# Patient Record
Sex: Female | Born: 1940 | Race: White | Hispanic: No | Smoking: Never smoker
Health system: Southern US, Community
[De-identification: ages and names within clinical notes are randomized; demographics above are authoritative.]

## PROBLEM LIST (undated history)

## (undated) DIAGNOSIS — J45909 Unspecified asthma, uncomplicated: Secondary | ICD-10-CM

## (undated) DIAGNOSIS — J069 Acute upper respiratory infection, unspecified: Secondary | ICD-10-CM

## (undated) DIAGNOSIS — E785 Hyperlipidemia, unspecified: Secondary | ICD-10-CM

## (undated) DIAGNOSIS — K219 Gastro-esophageal reflux disease without esophagitis: Secondary | ICD-10-CM

## (undated) DIAGNOSIS — I10 Essential (primary) hypertension: Secondary | ICD-10-CM

## (undated) DIAGNOSIS — L509 Urticaria, unspecified: Secondary | ICD-10-CM

## (undated) DIAGNOSIS — E079 Disorder of thyroid, unspecified: Secondary | ICD-10-CM

## (undated) HISTORY — PX: ADENOIDECTOMY: SUR15

## (undated) HISTORY — PX: APPENDECTOMY: SHX54

## (undated) HISTORY — PX: TONSILLECTOMY: SUR1361

## (undated) HISTORY — PX: CHOLECYSTECTOMY: SHX55

## (undated) HISTORY — DX: Urticaria, unspecified: L50.9

## (undated) HISTORY — PX: FRACTURE SURGERY: SHX138

## (undated) HISTORY — DX: Acute upper respiratory infection, unspecified: J06.9

## (undated) HISTORY — PX: ABDOMINAL HYSTERECTOMY: SHX81

---

## 2006-04-10 ENCOUNTER — Encounter: Admission: RE | Admit: 2006-04-10 | Discharge: 2006-04-10 | Payer: Self-pay | Admitting: Family Medicine

## 2007-05-20 ENCOUNTER — Encounter: Admission: RE | Admit: 2007-05-20 | Discharge: 2007-05-20 | Payer: Self-pay | Admitting: Family Medicine

## 2008-07-17 ENCOUNTER — Encounter: Admission: RE | Admit: 2008-07-17 | Discharge: 2008-07-17 | Payer: Self-pay

## 2011-05-24 ENCOUNTER — Emergency Department (INDEPENDENT_AMBULATORY_CARE_PROVIDER_SITE_OTHER): Payer: Medicare Other

## 2011-05-24 ENCOUNTER — Encounter (HOSPITAL_BASED_OUTPATIENT_CLINIC_OR_DEPARTMENT_OTHER): Payer: Self-pay | Admitting: *Deleted

## 2011-05-24 ENCOUNTER — Emergency Department (HOSPITAL_BASED_OUTPATIENT_CLINIC_OR_DEPARTMENT_OTHER)
Admission: EM | Admit: 2011-05-24 | Discharge: 2011-05-24 | Disposition: A | Discharge status: Home / Self Care | Payer: Medicare Other | Attending: Emergency Medicine | Admitting: Emergency Medicine

## 2011-05-24 DIAGNOSIS — R079 Chest pain, unspecified: Secondary | ICD-10-CM

## 2011-05-24 DIAGNOSIS — W19XXXA Unspecified fall, initial encounter: Secondary | ICD-10-CM

## 2011-05-24 DIAGNOSIS — E785 Hyperlipidemia, unspecified: Secondary | ICD-10-CM | POA: Insufficient documentation

## 2011-05-24 DIAGNOSIS — S42202A Unspecified fracture of upper end of left humerus, initial encounter for closed fracture: Secondary | ICD-10-CM

## 2011-05-24 DIAGNOSIS — S2239XA Fracture of one rib, unspecified side, initial encounter for closed fracture: Secondary | ICD-10-CM | POA: Insufficient documentation

## 2011-05-24 DIAGNOSIS — K219 Gastro-esophageal reflux disease without esophagitis: Secondary | ICD-10-CM | POA: Insufficient documentation

## 2011-05-24 DIAGNOSIS — R918 Other nonspecific abnormal finding of lung field: Secondary | ICD-10-CM

## 2011-05-24 DIAGNOSIS — Y92009 Unspecified place in unspecified non-institutional (private) residence as the place of occurrence of the external cause: Secondary | ICD-10-CM | POA: Insufficient documentation

## 2011-05-24 DIAGNOSIS — S42213A Unspecified displaced fracture of surgical neck of unspecified humerus, initial encounter for closed fracture: Secondary | ICD-10-CM | POA: Insufficient documentation

## 2011-05-24 DIAGNOSIS — I10 Essential (primary) hypertension: Secondary | ICD-10-CM | POA: Insufficient documentation

## 2011-05-24 DIAGNOSIS — E079 Disorder of thyroid, unspecified: Secondary | ICD-10-CM | POA: Insufficient documentation

## 2011-05-24 DIAGNOSIS — J9819 Other pulmonary collapse: Secondary | ICD-10-CM

## 2011-05-24 DIAGNOSIS — W1789XA Other fall from one level to another, initial encounter: Secondary | ICD-10-CM | POA: Insufficient documentation

## 2011-05-24 DIAGNOSIS — Y998 Other external cause status: Secondary | ICD-10-CM | POA: Insufficient documentation

## 2011-05-24 HISTORY — DX: Essential (primary) hypertension: I10

## 2011-05-24 HISTORY — DX: Disorder of thyroid, unspecified: E07.9

## 2011-05-24 HISTORY — DX: Gastro-esophageal reflux disease without esophagitis: K21.9

## 2011-05-24 HISTORY — DX: Hyperlipidemia, unspecified: E78.5

## 2011-05-24 LAB — CBC
MCHC: 34.7 g/dL (ref 30.0–36.0)
WBC: 12 10*3/uL — ABNORMAL HIGH (ref 4.0–10.5)

## 2011-05-24 LAB — URINALYSIS, ROUTINE W REFLEX MICROSCOPIC
Bilirubin Urine: NEGATIVE
Glucose, UA: NEGATIVE mg/dL
Ketones, ur: NEGATIVE mg/dL
Nitrite: NEGATIVE
Protein, ur: NEGATIVE mg/dL
pH: 6 (ref 5.0–8.0)

## 2011-05-24 LAB — BASIC METABOLIC PANEL
CO2: 28 mEq/L (ref 19–32)
Chloride: 105 mEq/L (ref 96–112)
Sodium: 140 mEq/L (ref 135–145)

## 2011-05-24 LAB — URINE MICROSCOPIC-ADD ON

## 2011-05-24 LAB — DIFFERENTIAL
Basophils Absolute: 0 10*3/uL (ref 0.0–0.1)
Basophils Relative: 0 % (ref 0–1)
Lymphocytes Relative: 10 % — ABNORMAL LOW (ref 12–46)
Monocytes Absolute: 0.8 10*3/uL (ref 0.1–1.0)
Neutro Abs: 9.9 10*3/uL — ABNORMAL HIGH (ref 1.7–7.7)
Neutrophils Relative %: 83 % — ABNORMAL HIGH (ref 43–77)

## 2011-05-24 MED ORDER — HYDROMORPHONE HCL PF 1 MG/ML IJ SOLN
1.0000 mg | Freq: Once | INTRAMUSCULAR | Status: AC
  Administered 2011-05-24: 1 mg via INTRAVENOUS

## 2011-05-24 MED ORDER — ONDANSETRON HCL 4 MG/2ML IJ SOLN
4.0000 mg | Freq: Once | INTRAMUSCULAR | Status: AC
  Administered 2011-05-24: 4 mg via INTRAMUSCULAR

## 2011-05-24 MED ORDER — IOHEXOL 300 MG/ML  SOLN
80.0000 mL | Freq: Once | INTRAMUSCULAR | Status: AC | PRN
  Administered 2011-05-24: 80 mL via INTRAVENOUS

## 2011-05-24 MED ORDER — SODIUM CHLORIDE 0.9 % IV SOLN
INTRAVENOUS | Status: DC
  Administered 2011-05-24: 19:00:00 via INTRAVENOUS

## 2011-05-24 MED ORDER — HYDROMORPHONE HCL PF 1 MG/ML IJ SOLN
INTRAMUSCULAR | Status: AC
  Filled 2011-05-24: qty 1

## 2011-05-24 MED ORDER — HYDROMORPHONE HCL PF 1 MG/ML IJ SOLN
INTRAMUSCULAR | Status: AC
  Administered 2011-05-24: 1 mg via INTRAVENOUS
  Filled 2011-05-24: qty 1

## 2011-05-24 MED ORDER — ONDANSETRON HCL 4 MG/2ML IJ SOLN
INTRAMUSCULAR | Status: AC
  Administered 2011-05-24: 4 mg via INTRAVENOUS
  Filled 2011-05-24: qty 2

## 2011-05-24 MED ORDER — ONDANSETRON HCL 4 MG/2ML IJ SOLN
INTRAMUSCULAR | Status: AC
  Filled 2011-05-24: qty 2

## 2011-05-24 MED ORDER — ONDANSETRON HCL 4 MG/2ML IJ SOLN
4.0000 mg | Freq: Once | INTRAMUSCULAR | Status: AC
  Administered 2011-05-24: 4 mg via INTRAVENOUS

## 2011-05-24 MED ORDER — HYDROMORPHONE HCL PF 1 MG/ML IJ SOLN
1.0000 mg | Freq: Once | INTRAMUSCULAR | Status: AC
  Administered 2011-05-24: 1 mg via INTRAMUSCULAR

## 2011-05-24 MED ORDER — ONDANSETRON HCL 4 MG PO TABS: 4.0000 mg | ORAL_TABLET | Freq: Four times a day (QID) | ORAL | Status: AC

## 2011-05-24 MED ORDER — OXYCODONE-ACETAMINOPHEN 5-325 MG PO TABS
1.0000 | ORAL_TABLET | Freq: Once | ORAL | Status: AC
  Administered 2011-05-24: 1 via ORAL
  Filled 2011-05-24: qty 1

## 2011-05-24 MED ORDER — OXYCODONE-ACETAMINOPHEN 5-325 MG PO TABS: 1.0000 | ORAL_TABLET | ORAL | Status: AC | PRN

## 2011-05-24 NOTE — ED Notes (Signed)
Pt slipped off of stool landing on left side injuring her left shoulder, left elbow and left ribs. Pt denies loc.

## 2011-05-24 NOTE — ED Notes (Signed)
Pushed approx 1/3 of the dilaudid IV administration, and pt c/o severe nausea. Requesting no more dilaudid IV. Pt ate crackers and drank coke prior to dilaudid, but states she still feels nauseous. Will notify MD.

## 2011-05-24 NOTE — ED Notes (Signed)
Dr. Ignacia Palma informed of pt status.

## 2011-05-24 NOTE — ED Provider Notes (Signed)
History     CSN: [id number]  Arrival date & time 05/24/11  1507   First MD Initiated Contact with Patient 05/24/11 1526      Chief Complaint  Patient presents with  . Fall    (Consider location/radiation/quality/duration/timing/severity/associated sxs/prior treatment) HPI Comments: Patient is a 71 year old woman who was up on a ladder in her kitchen and fell. She hit her left shoulder on the microwave, and also hit her over the chest. She was rendered unconscious. There was no bleeding. The injury happened about 2:30 PM. She took no medications for pain. She was brought to [facility] ED for evaluation.  Patient is a 71 y.o. female presenting with fall. The history is provided by the patient. No language interpreter was used.  Fall The accident occurred 1 to 2 hours ago. The fall occurred from a ladder. She fell from a height of 3 to 5 ft. Impact surface: She hit her shoulder on a microwave oven. There was no blood loss. The point of impact was the left shoulder. The pain is present in the left shoulder. The pain is at a severity of 9/10. The pain is severe. She was ambulatory at the scene. There was no entrapment after the fall. There was no drug use involved in the accident. There was no alcohol use involved in the accident. Exacerbated by: Movement of the left shoulder, palpation of the left shoulder. She has tried immobilization (She was transported to [facility] [place] ED by EMS.) for the symptoms. The treatment provided no relief.    Past Medical History  Diagnosis Date  . Hyperlipidemia   . Hypertension   . Thyroid disease   . GERD (gastroesophageal reflux disease)     Past Surgical History  Procedure Date  . Fracture surgery   . Abdominal hysterectomy   . Appendectomy   . Tonsillectomy     No family history on file.  History  Substance Use Topics  . Smoking status: Never Smoker   . Smokeless tobacco: Not on file  . Alcohol Use: No    OB History    Grav Para Term Preterm Abortions TAB SAB Ect Mult Living                  Review of Systems  All other systems reviewed and are negative.    Allergies  Review of patient's allergies indicates no known allergies.  Home Medications  No current outpatient prescriptions on file.  BP 142/77  Pulse 70  Temp(Src) 98 F (36.7 C) (Oral)  Resp 20  SpO2 97%  Physical Exam  Nursing note and vitals reviewed. Constitutional: She is oriented to person, place, and time. She appears well-developed and well-nourished.       In acute distress with pain in the left shoulder.  HENT:  Head: Normocephalic and atraumatic.  Right Ear: External ear normal.  Left Ear: External ear normal.  Mouth/Throat: Oropharynx is clear and moist.        No bony deformity of the skull. No lacerations.  Eyes: Conjunctivae and EOM are normal. Pupils are equal, round, and reactive to light.  Neck: Normal range of motion. Neck supple.       No bony deformity of the neck.  Cardiovascular: Normal rate and regular rhythm.   Pulmonary/Chest: Effort normal and breath sounds normal.       No bony deformity of the chest or tenderness over the left ribs. There is no crepitance or subcutaneous emphysema.  Abdominal: Soft. Bowel sounds are normal.  Musculoskeletal:       She has tenderness and swelling over the left shoulder, overlying the proximal right humeral head. Skin is intact. She has intact sensation and tendon function in her left arm.  Neurological: She is alert and oriented to person, place, and time.       Sensory or motor deficit.  Skin: Skin is warm and dry.  Psychiatric: She has a normal mood and affect. Her behavior is normal.    ED Course  Procedures (including critical care time)  3:32 PM Patient was seen and had physical examination. IM Dilaudid and Zofran were ordered. X-ray of the left shoulder and chest were ordered.  4:19 PM X-ray of the left shoulder shows an impacted fracture of the surgical  neck of the left humerus. I advised the patient of this. Shoulder immobilizer ordered. Chest x-ray is pending.  4:34 PM Radiology called to inform me that the patient appears to have a fracture of her left sixth rib, and possible hemothorax. He also notes some calcifications in her lungs. CT of the chest with IV contrast was ordered.  7:18 PM CT of the chest showed a nondisplaced fracture of the left sixth rib. There is no pneumothorax or effusion. There is also no mass in the right chest. Patient was advised to rest in a recliner propped up on several pillows. She can take Percocet every 4 hours as needed for pain. She should wear her shoulder immobilizer 24 hours per day. She will need to contact the orthopedist on call, for followup next week.    1. Closed fracture of left proximal humerus            Carleene Cooper III, MD 05/24/11 1541  Carleene Cooper III, MD 05/24/11 Ernestina [place]

## 2011-05-24 NOTE — ED Notes (Signed)
New orders received for CT chest. Pt and family informed.

## 2011-05-24 NOTE — ED Notes (Signed)
Pt and family informed of plan of care.

## 2011-05-24 NOTE — ED Notes (Signed)
Dr Ignacia Palma at bedside to discuss test results. Pt requesting coke and crackers

## 2011-05-24 NOTE — ED Notes (Signed)
Pt pale and diaphoretic. Fluids increased and oxygen applied for sats of 91%. Increased to 97 on oxygen. Cool wash cloth applied to head. Pt stated "yeh im a light weight. i dont do well with strong meds"

## 2011-05-24 NOTE — ED Notes (Signed)
Nausea subsided. Pt assisted into shoulder immobilizer sling and instructed on usage at home. Pt's pain more tolerable at this time. Assisted into wheelchair, and wheeled out to husbands car. Pt assisted into vehicle without difficulties.

## 2011-05-24 NOTE — ED Notes (Signed)
Pt transported to ED room 10.  Family at bedside.

## 2011-07-05 ENCOUNTER — Ambulatory Visit: Payer: Medicare Other | Attending: Orthopedic Surgery | Admitting: Physical Therapy

## 2011-07-05 DIAGNOSIS — IMO0001 Reserved for inherently not codable concepts without codable children: Secondary | ICD-10-CM | POA: Insufficient documentation

## 2011-07-05 DIAGNOSIS — M25519 Pain in unspecified shoulder: Secondary | ICD-10-CM | POA: Insufficient documentation

## 2011-07-05 DIAGNOSIS — M25619 Stiffness of unspecified shoulder, not elsewhere classified: Secondary | ICD-10-CM | POA: Insufficient documentation

## 2011-07-12 ENCOUNTER — Ambulatory Visit: Payer: Medicare Other | Attending: Orthopedic Surgery

## 2011-07-12 DIAGNOSIS — M25619 Stiffness of unspecified shoulder, not elsewhere classified: Secondary | ICD-10-CM | POA: Insufficient documentation

## 2011-07-12 DIAGNOSIS — M25519 Pain in unspecified shoulder: Secondary | ICD-10-CM | POA: Insufficient documentation

## 2011-07-12 DIAGNOSIS — IMO0001 Reserved for inherently not codable concepts without codable children: Secondary | ICD-10-CM | POA: Insufficient documentation

## 2011-07-20 ENCOUNTER — Ambulatory Visit: Payer: Medicare Other | Admitting: Physical Therapy

## 2011-07-26 ENCOUNTER — Encounter: Payer: Medicare Other | Admitting: Physical Therapy

## 2011-08-01 ENCOUNTER — Ambulatory Visit: Payer: Medicare Other | Admitting: Physical Therapy

## 2011-08-03 ENCOUNTER — Ambulatory Visit: Payer: Medicare Other | Admitting: Physical Therapy

## 2011-08-08 ENCOUNTER — Ambulatory Visit: Payer: Medicare Other | Attending: Orthopedic Surgery | Admitting: Physical Therapy

## 2011-08-08 DIAGNOSIS — M25519 Pain in unspecified shoulder: Secondary | ICD-10-CM | POA: Insufficient documentation

## 2011-08-08 DIAGNOSIS — IMO0001 Reserved for inherently not codable concepts without codable children: Secondary | ICD-10-CM | POA: Insufficient documentation

## 2011-08-08 DIAGNOSIS — M25619 Stiffness of unspecified shoulder, not elsewhere classified: Secondary | ICD-10-CM | POA: Insufficient documentation

## 2011-08-10 ENCOUNTER — Ambulatory Visit: Payer: Medicare Other | Admitting: Physical Therapy

## 2011-08-15 ENCOUNTER — Ambulatory Visit: Payer: Medicare Other | Admitting: Physical Therapy

## 2011-08-16 ENCOUNTER — Other Ambulatory Visit: Payer: Self-pay | Admitting: Orthopedic Surgery

## 2011-08-16 DIAGNOSIS — M25512 Pain in left shoulder: Secondary | ICD-10-CM

## 2011-08-16 DIAGNOSIS — M542 Cervicalgia: Secondary | ICD-10-CM

## 2011-08-17 ENCOUNTER — Ambulatory Visit: Payer: Medicare Other | Admitting: Physical Therapy

## 2011-08-22 ENCOUNTER — Encounter: Payer: Medicare Other | Admitting: Physical Therapy

## 2011-08-24 ENCOUNTER — Encounter: Payer: Medicare Other | Admitting: Physical Therapy

## 2011-08-29 ENCOUNTER — Ambulatory Visit: Payer: Medicare Other | Admitting: Physical Therapy

## 2011-08-30 ENCOUNTER — Ambulatory Visit
Admission: RE | Admit: 2011-08-30 | Discharge: 2011-08-30 | Disposition: A | Discharge status: Home / Self Care | Payer: Medicare Other | Source: Ambulatory Visit | Attending: Orthopedic Surgery | Admitting: Orthopedic Surgery

## 2011-08-30 DIAGNOSIS — M542 Cervicalgia: Secondary | ICD-10-CM

## 2011-08-30 DIAGNOSIS — M25512 Pain in left shoulder: Secondary | ICD-10-CM

## 2011-08-31 ENCOUNTER — Encounter: Payer: Medicare Other | Admitting: Physical Therapy

## 2011-09-05 ENCOUNTER — Ambulatory Visit: Payer: Medicare Other | Admitting: Physical Therapy

## 2011-09-07 ENCOUNTER — Ambulatory Visit: Payer: Medicare Other | Attending: Orthopedic Surgery | Admitting: Physical Therapy

## 2011-09-07 DIAGNOSIS — IMO0001 Reserved for inherently not codable concepts without codable children: Secondary | ICD-10-CM | POA: Insufficient documentation

## 2011-09-07 DIAGNOSIS — M25519 Pain in unspecified shoulder: Secondary | ICD-10-CM | POA: Insufficient documentation

## 2011-09-07 DIAGNOSIS — M25619 Stiffness of unspecified shoulder, not elsewhere classified: Secondary | ICD-10-CM | POA: Insufficient documentation

## 2011-09-12 ENCOUNTER — Ambulatory Visit: Payer: Medicare Other | Admitting: Physical Therapy

## 2011-09-14 ENCOUNTER — Ambulatory Visit: Payer: Medicare Other | Admitting: Physical Therapy

## 2011-09-19 ENCOUNTER — Encounter: Payer: Medicare Other | Admitting: Physical Therapy

## 2011-09-21 ENCOUNTER — Ambulatory Visit: Payer: Medicare Other | Admitting: Physical Therapy

## 2011-09-26 ENCOUNTER — Ambulatory Visit: Payer: Medicare Other | Admitting: Physical Therapy

## 2011-09-28 ENCOUNTER — Encounter: Payer: Medicare Other | Admitting: Physical Therapy

## 2011-10-03 ENCOUNTER — Ambulatory Visit: Payer: Medicare Other | Admitting: Physical Therapy

## 2011-10-05 ENCOUNTER — Ambulatory Visit: Payer: Medicare Other | Admitting: Physical Therapy

## 2011-10-09 ENCOUNTER — Ambulatory Visit: Payer: Medicare Other | Admitting: Physical Therapy

## 2011-10-12 ENCOUNTER — Ambulatory Visit: Payer: Medicare Other | Admitting: Physical Therapy

## 2011-10-16 ENCOUNTER — Ambulatory Visit: Payer: Medicare Other | Attending: Orthopedic Surgery | Admitting: Physical Therapy

## 2011-10-16 DIAGNOSIS — IMO0001 Reserved for inherently not codable concepts without codable children: Secondary | ICD-10-CM | POA: Insufficient documentation

## 2011-10-16 DIAGNOSIS — M25519 Pain in unspecified shoulder: Secondary | ICD-10-CM | POA: Insufficient documentation

## 2011-10-16 DIAGNOSIS — M25619 Stiffness of unspecified shoulder, not elsewhere classified: Secondary | ICD-10-CM | POA: Insufficient documentation

## 2011-10-19 ENCOUNTER — Ambulatory Visit: Payer: Medicare Other | Admitting: Physical Therapy

## 2011-10-23 ENCOUNTER — Ambulatory Visit: Payer: Medicare Other | Admitting: Physical Therapy

## 2011-10-26 ENCOUNTER — Ambulatory Visit: Payer: Medicare Other | Admitting: Physical Therapy

## 2011-10-30 ENCOUNTER — Ambulatory Visit: Payer: Medicare Other | Admitting: Physical Therapy

## 2011-11-02 ENCOUNTER — Ambulatory Visit: Payer: Medicare Other | Admitting: Physical Therapy

## 2011-11-13 ENCOUNTER — Ambulatory Visit: Payer: Medicare Other | Attending: Orthopedic Surgery | Admitting: Physical Therapy

## 2011-11-13 DIAGNOSIS — IMO0001 Reserved for inherently not codable concepts without codable children: Secondary | ICD-10-CM | POA: Insufficient documentation

## 2011-11-13 DIAGNOSIS — M25519 Pain in unspecified shoulder: Secondary | ICD-10-CM | POA: Insufficient documentation

## 2011-11-13 DIAGNOSIS — M25619 Stiffness of unspecified shoulder, not elsewhere classified: Secondary | ICD-10-CM | POA: Insufficient documentation

## 2011-11-16 ENCOUNTER — Ambulatory Visit: Payer: Medicare Other | Admitting: Physical Therapy

## 2011-11-20 ENCOUNTER — Ambulatory Visit: Payer: Medicare Other | Admitting: Physical Therapy

## 2011-11-27 ENCOUNTER — Ambulatory Visit: Payer: Medicare Other | Admitting: Physical Therapy

## 2011-11-30 ENCOUNTER — Ambulatory Visit: Payer: Medicare Other | Admitting: Physical Therapy

## 2011-12-04 ENCOUNTER — Ambulatory Visit: Payer: Medicare Other | Admitting: Physical Therapy

## 2011-12-07 ENCOUNTER — Ambulatory Visit: Payer: Medicare Other | Admitting: Physical Therapy

## 2011-12-19 ENCOUNTER — Ambulatory Visit: Payer: Medicare Other | Attending: Orthopedic Surgery | Admitting: Physical Therapy

## 2011-12-19 DIAGNOSIS — M25619 Stiffness of unspecified shoulder, not elsewhere classified: Secondary | ICD-10-CM | POA: Insufficient documentation

## 2011-12-19 DIAGNOSIS — M25519 Pain in unspecified shoulder: Secondary | ICD-10-CM | POA: Insufficient documentation

## 2011-12-19 DIAGNOSIS — IMO0001 Reserved for inherently not codable concepts without codable children: Secondary | ICD-10-CM | POA: Insufficient documentation

## 2011-12-21 ENCOUNTER — Ambulatory Visit: Payer: Medicare Other | Admitting: Physical Therapy

## 2011-12-27 ENCOUNTER — Ambulatory Visit: Payer: Medicare Other | Admitting: Rehabilitation

## 2012-01-03 ENCOUNTER — Ambulatory Visit: Payer: Medicare Other | Admitting: Rehabilitation

## 2012-01-09 ENCOUNTER — Ambulatory Visit: Payer: Medicare Other | Admitting: Physical Therapy

## 2012-01-11 ENCOUNTER — Ambulatory Visit: Payer: Medicare Other | Admitting: Physical Therapy

## 2012-03-11 ENCOUNTER — Ambulatory Visit: Payer: Medicare Other | Attending: Orthopedic Surgery | Admitting: Physical Therapy

## 2012-03-11 DIAGNOSIS — M25619 Stiffness of unspecified shoulder, not elsewhere classified: Secondary | ICD-10-CM | POA: Insufficient documentation

## 2012-03-11 DIAGNOSIS — IMO0001 Reserved for inherently not codable concepts without codable children: Secondary | ICD-10-CM | POA: Insufficient documentation

## 2012-03-11 DIAGNOSIS — M25519 Pain in unspecified shoulder: Secondary | ICD-10-CM | POA: Insufficient documentation

## 2012-03-14 ENCOUNTER — Encounter: Payer: Medicare Other | Admitting: Physical Therapy

## 2012-03-20 ENCOUNTER — Ambulatory Visit: Payer: Medicare Other | Admitting: Physical Therapy

## 2012-03-25 ENCOUNTER — Ambulatory Visit: Payer: Medicare Other | Admitting: Physical Therapy

## 2012-03-28 ENCOUNTER — Ambulatory Visit: Payer: Medicare Other | Admitting: Physical Therapy

## 2012-04-01 ENCOUNTER — Ambulatory Visit: Payer: Medicare Other | Admitting: Physical Therapy

## 2012-04-08 ENCOUNTER — Ambulatory Visit: Payer: Medicare Other | Attending: Orthopedic Surgery | Admitting: Physical Therapy

## 2012-04-08 DIAGNOSIS — M25519 Pain in unspecified shoulder: Secondary | ICD-10-CM | POA: Insufficient documentation

## 2012-04-08 DIAGNOSIS — M25619 Stiffness of unspecified shoulder, not elsewhere classified: Secondary | ICD-10-CM | POA: Insufficient documentation

## 2012-04-08 DIAGNOSIS — IMO0001 Reserved for inherently not codable concepts without codable children: Secondary | ICD-10-CM | POA: Insufficient documentation

## 2012-04-11 ENCOUNTER — Ambulatory Visit: Payer: Medicare Other | Admitting: Physical Therapy

## 2012-04-15 ENCOUNTER — Ambulatory Visit: Payer: Medicare Other | Admitting: Physical Therapy

## 2012-04-18 ENCOUNTER — Ambulatory Visit: Payer: Medicare Other | Admitting: Physical Therapy

## 2012-04-23 ENCOUNTER — Ambulatory Visit: Payer: Medicare Other | Admitting: Physical Therapy

## 2012-05-07 ENCOUNTER — Ambulatory Visit: Payer: Medicare Other | Admitting: Physical Therapy

## 2012-05-09 ENCOUNTER — Ambulatory Visit: Payer: Medicare Other | Admitting: Physical Therapy

## 2012-06-04 ENCOUNTER — Ambulatory Visit: Payer: Medicare Other | Admitting: Rehabilitation

## 2012-06-04 ENCOUNTER — Ambulatory Visit: Payer: Medicare Other | Attending: Orthopedic Surgery | Admitting: Physical Therapy

## 2012-06-04 DIAGNOSIS — M25519 Pain in unspecified shoulder: Secondary | ICD-10-CM | POA: Insufficient documentation

## 2012-06-04 DIAGNOSIS — M25619 Stiffness of unspecified shoulder, not elsewhere classified: Secondary | ICD-10-CM | POA: Insufficient documentation

## 2012-06-04 DIAGNOSIS — IMO0001 Reserved for inherently not codable concepts without codable children: Secondary | ICD-10-CM | POA: Insufficient documentation

## 2012-06-06 ENCOUNTER — Ambulatory Visit: Payer: Medicare Other | Admitting: Physical Therapy

## 2012-06-11 ENCOUNTER — Ambulatory Visit: Payer: Medicare Other | Admitting: Physical Therapy

## 2012-06-13 ENCOUNTER — Ambulatory Visit: Payer: Medicare Other | Attending: Internal Medicine | Admitting: Physical Therapy

## 2012-06-13 DIAGNOSIS — M25519 Pain in unspecified shoulder: Secondary | ICD-10-CM | POA: Insufficient documentation

## 2012-06-13 DIAGNOSIS — M25619 Stiffness of unspecified shoulder, not elsewhere classified: Secondary | ICD-10-CM | POA: Insufficient documentation

## 2012-06-13 DIAGNOSIS — IMO0001 Reserved for inherently not codable concepts without codable children: Secondary | ICD-10-CM | POA: Insufficient documentation

## 2012-06-18 ENCOUNTER — Ambulatory Visit: Payer: Medicare Other | Admitting: Physical Therapy

## 2012-06-20 ENCOUNTER — Ambulatory Visit: Payer: Medicare Other | Admitting: Physical Therapy

## 2012-06-25 ENCOUNTER — Ambulatory Visit: Payer: Medicare Other | Admitting: Physical Therapy

## 2012-07-01 ENCOUNTER — Encounter: Payer: Medicare Other | Admitting: Physical Therapy

## 2012-07-04 ENCOUNTER — Encounter: Payer: Medicare Other | Admitting: Physical Therapy

## 2014-05-08 ENCOUNTER — Encounter (HOSPITAL_BASED_OUTPATIENT_CLINIC_OR_DEPARTMENT_OTHER): Payer: Self-pay | Admitting: *Deleted

## 2014-05-08 ENCOUNTER — Emergency Department (HOSPITAL_BASED_OUTPATIENT_CLINIC_OR_DEPARTMENT_OTHER): Payer: Medicare Other

## 2014-05-08 ENCOUNTER — Emergency Department (HOSPITAL_BASED_OUTPATIENT_CLINIC_OR_DEPARTMENT_OTHER)
Admission: EM | Admit: 2014-05-08 | Discharge: 2014-05-08 | Disposition: A | Discharge status: Home / Self Care | Payer: Medicare Other | Attending: Emergency Medicine | Admitting: Emergency Medicine

## 2014-05-08 DIAGNOSIS — E785 Hyperlipidemia, unspecified: Secondary | ICD-10-CM | POA: Diagnosis not present

## 2014-05-08 DIAGNOSIS — Z8719 Personal history of other diseases of the digestive system: Secondary | ICD-10-CM | POA: Insufficient documentation

## 2014-05-08 DIAGNOSIS — J40 Bronchitis, not specified as acute or chronic: Secondary | ICD-10-CM

## 2014-05-08 DIAGNOSIS — Z79899 Other long term (current) drug therapy: Secondary | ICD-10-CM | POA: Insufficient documentation

## 2014-05-08 DIAGNOSIS — R059 Cough, unspecified: Secondary | ICD-10-CM

## 2014-05-08 DIAGNOSIS — Z8781 Personal history of (healed) traumatic fracture: Secondary | ICD-10-CM | POA: Diagnosis not present

## 2014-05-08 DIAGNOSIS — I10 Essential (primary) hypertension: Secondary | ICD-10-CM | POA: Insufficient documentation

## 2014-05-08 DIAGNOSIS — R0602 Shortness of breath: Secondary | ICD-10-CM | POA: Diagnosis present

## 2014-05-08 DIAGNOSIS — R05 Cough: Secondary | ICD-10-CM

## 2014-05-08 HISTORY — DX: Unspecified asthma, uncomplicated: J45.909

## 2014-05-08 LAB — CBC
HCT: 39.8 % (ref 36.0–46.0)
Hemoglobin: 13.4 g/dL (ref 12.0–15.0)
MCH: 30.2 pg (ref 26.0–34.0)
MCHC: 33.7 g/dL (ref 30.0–36.0)
MCV: 89.8 fL (ref 78.0–100.0)
Platelets: 244 10*3/uL (ref 150–400)
RBC: 4.43 MIL/uL (ref 3.87–5.11)
RDW: 12.4 % (ref 11.5–15.5)
WBC: 5.5 10*3/uL (ref 4.0–10.5)

## 2014-05-08 LAB — BRAIN NATRIURETIC PEPTIDE: B Natriuretic Peptide: 53.3 pg/mL (ref 0.0–100.0)

## 2014-05-08 LAB — BASIC METABOLIC PANEL
Anion gap: 9 (ref 5–15)
BUN: 20 mg/dL (ref 6–23)
CALCIUM: 9.5 mg/dL (ref 8.4–10.5)
CHLORIDE: 105 meq/L (ref 96–112)
CO2: 24 mmol/L (ref 19–32)
CREATININE: 0.92 mg/dL (ref 0.50–1.10)
GFR calc Af Amer: 70 mL/min — ABNORMAL LOW (ref >90)
GFR calc non Af Amer: 60 mL/min — ABNORMAL LOW (ref >90)
GLUCOSE: 154 mg/dL — AB (ref 70–99)
POTASSIUM: 4.2 mmol/L (ref 3.5–5.1)
SODIUM: 138 mmol/L (ref 135–145)

## 2014-05-08 LAB — TROPONIN I: Troponin I: <0.03 ng/mL (ref <0.031)

## 2014-05-08 LAB — D-DIMER, QUANTITATIVE (NOT AT ARMC)

## 2014-05-08 MED ORDER — ALBUTEROL SULFATE (2.5 MG/3ML) 0.083% IN NEBU
5.0000 mg | INHALATION_SOLUTION | Freq: Once | RESPIRATORY_TRACT | Status: AC
  Administered 2014-05-08: 5 mg via RESPIRATORY_TRACT
  Filled 2014-05-08: qty 6

## 2014-05-08 MED ORDER — ALBUTEROL SULFATE (2.5 MG/3ML) 0.083% IN NEBU: 2.5000 mg | INHALATION_SOLUTION | Freq: Four times a day (QID) | RESPIRATORY_TRACT | Status: DC | PRN

## 2014-05-08 MED ORDER — PREDNISONE 50 MG PO TABS: 50.0000 mg | ORAL_TABLET | Freq: Every day | ORAL | Status: DC

## 2014-05-08 MED ORDER — PREDNISONE 50 MG PO TABS
60.0000 mg | ORAL_TABLET | Freq: Once | ORAL | Status: AC
  Administered 2014-05-08: 60 mg via ORAL
  Filled 2014-05-08 (×2): qty 1

## 2014-05-08 NOTE — ED Notes (Signed)
Pt reports SOB, cough, chest tightness x 2 months.  Pt been treated with antibiotics.

## 2014-05-08 NOTE — Discharge Instructions (Signed)
Return to the ED with any concerns including difficulty breathing despite using albuterol2 puffs  every 4 hours, not drinking fluids, decreased urine output, vomiting and not able to keep down liquids or medications, decreased level of alertness/lethargy, or any other alarming symptoms °

## 2014-05-08 NOTE — ED Provider Notes (Signed)
CSN: [id number]     Arrival date & time 05/08/14  1319 History   First MD Initiated Contact with Patient 05/08/14 1356     Chief Complaint  Patient presents with  . Shortness of Breath     (Consider location/radiation/quality/duration/timing/severity/associated sxs/prior Treatment) HPI  Pt presenting with c/o cough, shortness of breath.  She states symptoms have been going on for over 4 weeks.  No fever.  Pt states she has had zithromax prescribed and has finished this course of abx.  Cough has not improved. She states the cough is productive of mucous, she feels her breathing is becoming more tight.  She is not able to sleep at night due to cough.  No chest pain, no leg swelling.  No vomiting or diarrhea.  No fever.  She states she has become fatigued due to frequent coughing and lack of sleep.  Pt states she has been using her albuterol inhaler but her albuterol neb helps more and she has run out of the solution. There are no other associated systemic symptoms, there are no other alleviating or modifying factors.   Past Medical History  Diagnosis Date  . Hyperlipidemia   . Hypertension   . Thyroid disease   . GERD (gastroesophageal reflux disease)   . Asthma    Past Surgical History  Procedure Laterality Date  . Fracture surgery    . Abdominal hysterectomy    . Appendectomy    . Tonsillectomy    . Cholecystectomy     History reviewed. No pertinent family history. History  Substance Use Topics  . Smoking status: Never Smoker   . Smokeless tobacco: Not on file  . Alcohol Use: No   OB History    No data available     Review of Systems  ROS reviewed and all otherwise negative except for mentioned in HPI    Allergies  Review of patient's allergies indicates no known allergies.  Home Medications   Prior to Admission medications   Medication Sig Start Date End Date Taking? Authorizing Provider  amLODipine-benazepril (LOTREL) 10-20 MG per capsule Take 1 capsule by mouth  daily.   Yes Historical Provider, MD  rosuvastatin (CRESTOR) 20 MG tablet Take 20 mg by mouth daily.   Yes Historical Provider, MD  sertraline (ZOLOFT) 100 MG tablet Take 100 mg by mouth daily.   Yes Historical Provider, MD  albuterol (PROVENTIL) (2.5 MG/3ML) 0.083% nebulizer solution Take 3 mLs (2.5 mg total) by nebulization every 6 (six) hours as needed for wheezing or shortness of breath. 05/08/14   Ethelda Chick, MD  predniSONE (DELTASONE) 50 MG tablet Take 1 tablet (50 mg total) by mouth daily. 05/08/14   Ethelda Chick, MD   BP 152/67 mmHg  Pulse 93  Resp 20  Ht  (1.575 m)  Wt 143 lb (64.864 kg)  BMI 26.15 kg/m2  SpO2 93%  Vitals reviewed Physical Exam  Physical Examination: General appearance - alert, well appearing, and in no distress Mental status - alert, oriented to person, place, and time Eyes - no conjunctival injection, no scleral icterus Mouth - mucous membranes moist, pharynx normal without lesions Chest - clear to auscultation, no wheezes, rales or rhonchi, symmetric air entry, frequent coughing Heart - normal rate, regular rhythm, normal S1, S2, no murmurs, rubs, clicks or gallops Abdomen - soft, nontender, nondistended, no masses or organomegaly Extremities - peripheral pulses normal, no pedal edema, no clubbing or cyanosis Skin - normal coloration and turgor, no rashes  ED  Course  Procedures (including critical care time) Labs Review Labs Reviewed  BASIC METABOLIC PANEL - Abnormal; Notable for the following:    Glucose, Bld 154 (*)    GFR calc non Af Amer 60 (*)    GFR calc Af Amer 70 (*)    All other components within normal limits  CBC  TROPONIN I  BRAIN NATRIURETIC PEPTIDE  D-DIMER, QUANTITATIVE    Imaging Review Dg Chest 2 View  05/08/2014   CLINICAL DATA:  Initial encounter for Cough lasting about 4 weeks.  EXAM: CHEST  2 VIEW  COMPARISON:  05/24/2011.  FINDINGS: The lungs are clear without focal infiltrate, edema, pneumothorax or pleural  effusion. The linear right suprahilar density is unchanged in the interval. The cardiopericardial silhouette is within normal limits for size. Imaged bony structures of the thorax are intact. Telemetry leads overlie the chest.  IMPRESSION: No active cardiopulmonary disease.   Electronically Signed   By: Kennith Center M.D.   On: 05/08/2014 13:48     EKG Interpretation   Date/Time:  Friday May 08 2014 13:32:48 EST Ventricular Rate:  81 PR Interval:  150 QRS Duration: 84 QT Interval:  364 QTC Calculation: 422 R Axis:   -17 Text Interpretation:  Sinus rhythm with Premature atrial complexes  Otherwise normal ECG No old tracing to compare Confirmed by [facility]  MD,  Cumi Sanagustin [id number]) on 05/08/2014 1:41:35 PM      MDM   Final diagnoses:  Bronchitis    Pt presenting with c/o cough, chest tightness with some shortness of breath over the past several weeks.  She has had zpack which did not help her symptoms.  Pt given steroids, refill of her albuterol neb solution.   Patient is overall nontoxic and well hydrated in appearance. Advised to arrange for close followup with her PMD. Other labs including EKG, BNP, d-dimer are reassuring.      Ethelda Chick, MD 05/09/14 [phone]

## 2014-11-25 ENCOUNTER — Encounter (HOSPITAL_BASED_OUTPATIENT_CLINIC_OR_DEPARTMENT_OTHER): Payer: Self-pay

## 2014-11-25 ENCOUNTER — Emergency Department (HOSPITAL_BASED_OUTPATIENT_CLINIC_OR_DEPARTMENT_OTHER)
Admission: EM | Admit: 2014-11-25 | Discharge: 2014-11-25 | Disposition: A | Discharge status: Home / Self Care | Payer: Medicare Other | Attending: Emergency Medicine | Admitting: Emergency Medicine

## 2014-11-25 ENCOUNTER — Emergency Department (HOSPITAL_BASED_OUTPATIENT_CLINIC_OR_DEPARTMENT_OTHER): Payer: Medicare Other

## 2014-11-25 DIAGNOSIS — K219 Gastro-esophageal reflux disease without esophagitis: Secondary | ICD-10-CM | POA: Diagnosis not present

## 2014-11-25 DIAGNOSIS — Z880 Allergy status to penicillin: Secondary | ICD-10-CM | POA: Insufficient documentation

## 2014-11-25 DIAGNOSIS — J45909 Unspecified asthma, uncomplicated: Secondary | ICD-10-CM | POA: Diagnosis not present

## 2014-11-25 DIAGNOSIS — R110 Nausea: Secondary | ICD-10-CM | POA: Diagnosis not present

## 2014-11-25 DIAGNOSIS — Z79899 Other long term (current) drug therapy: Secondary | ICD-10-CM | POA: Diagnosis not present

## 2014-11-25 DIAGNOSIS — I10 Essential (primary) hypertension: Secondary | ICD-10-CM | POA: Diagnosis not present

## 2014-11-25 DIAGNOSIS — E785 Hyperlipidemia, unspecified: Secondary | ICD-10-CM | POA: Insufficient documentation

## 2014-11-25 DIAGNOSIS — R079 Chest pain, unspecified: Secondary | ICD-10-CM | POA: Insufficient documentation

## 2014-11-25 LAB — CBC
HEMATOCRIT: 41 % (ref 36.0–46.0)
Hemoglobin: 14.1 g/dL (ref 12.0–15.0)
MCH: 31.1 pg (ref 26.0–34.0)
MCHC: 34.4 g/dL (ref 30.0–36.0)
MCV: 90.3 fL (ref 78.0–100.0)
PLATELETS: 277 10*3/uL (ref 150–400)
RBC: 4.54 MIL/uL (ref 3.87–5.11)
RDW: 12 % (ref 11.5–15.5)
WBC: 6.8 10*3/uL (ref 4.0–10.5)

## 2014-11-25 LAB — COMPREHENSIVE METABOLIC PANEL
ALBUMIN: 4 g/dL (ref 3.5–5.0)
ALT: 26 U/L (ref 14–54)
ANION GAP: 6 (ref 5–15)
AST: 21 U/L (ref 15–41)
Alkaline Phosphatase: 81 U/L (ref 38–126)
BILIRUBIN TOTAL: 0.4 mg/dL (ref 0.3–1.2)
BUN: 20 mg/dL (ref 6–20)
CO2: 27 mmol/L (ref 22–32)
Calcium: 9.7 mg/dL (ref 8.9–10.3)
Chloride: 107 mmol/L (ref 101–111)
Creatinine, Ser: 0.84 mg/dL (ref 0.44–1.00)
Glucose, Bld: 102 mg/dL — ABNORMAL HIGH (ref 65–99)
Potassium: 4.4 mmol/L (ref 3.5–5.1)
Sodium: 140 mmol/L (ref 135–145)
Total Protein: 6.7 g/dL (ref 6.5–8.1)

## 2014-11-25 LAB — TROPONIN I
Troponin I: <0.03 ng/mL (ref <0.031)
Troponin I: <0.03 ng/mL (ref <0.031)

## 2014-11-25 LAB — D-DIMER, QUANTITATIVE: D-Dimer, Quant: 0.37 ug/mL-FEU (ref 0.00–0.48)

## 2014-11-25 LAB — LIPASE, BLOOD: Lipase: 23 U/L (ref 22–51)

## 2014-11-25 MED ORDER — ASPIRIN 81 MG PO CHEW
324.0000 mg | CHEWABLE_TABLET | Freq: Once | ORAL | Status: AC
Start: 2014-11-25 — End: 2014-11-25
  Administered 2014-11-25: 324 mg via ORAL
  Filled 2014-11-25: qty 4

## 2014-11-25 MED ORDER — ALUM & MAG HYDROXIDE-SIMETH 200-200-20 MG/5ML PO SUSP
30.0000 mL | Freq: Once | ORAL | Status: AC
  Administered 2014-11-25: 30 mL via ORAL
  Filled 2014-11-25: qty 30

## 2014-11-25 MED ORDER — RANITIDINE HCL 150 MG/10ML PO SYRP
150.0000 mg | ORAL_SOLUTION | Freq: Once | ORAL | Status: AC
  Administered 2014-11-25: 150 mg via ORAL
  Filled 2014-11-25: qty 10

## 2014-11-25 MED ORDER — LIDOCAINE VISCOUS 2 % MT SOLN
15.0000 mL | Freq: Once | OROMUCOSAL | Status: AC
  Administered 2014-11-25: 15 mL via OROMUCOSAL
  Filled 2014-11-25: qty 15

## 2014-11-25 MED ORDER — RANITIDINE HCL 150 MG PO TABS: 150.0000 mg | ORAL_TABLET | Freq: Two times a day (BID) | ORAL | Status: DC

## 2014-11-25 NOTE — ED Notes (Signed)
Patient transported to X-ray 

## 2014-11-25 NOTE — ED Notes (Signed)
MD at bedside. 

## 2014-11-25 NOTE — ED Provider Notes (Addendum)
CSN: [id number]     Arrival date & time 11/25/14  1756 History  This chart was scribed for Marily MemosJason Lue [place], MD by Lyndel SafeKaitlyn Shelton, ED Scribe. This patient was seen in room MH08/MH08 and the patient's care was started 6:17 PM.  Chief Complaint  Patient presents with  . Chest Pain   The history is provided by the patient. No language interpreter was used.   HPI Comments: Anne Soto is a 74 y.o. female, with a PMhx of HLD, HTN, thyroid disease, asthma, and GERD, who presents to the Emergency Department complaining of sudden onset, gradually worsening, constant dyspepsia that elicited chest pain which radiated to her back onset 7 hours ago. Pt also reports associated nausea, and an intermittent cough that was present prior to onset of current symptoms. The pt notes she has not experienced episodes of dyspepsia of  this nature prior to this episode. She states a past surgical history of hysterectomy, appendectomy, and most recently a cholecystectomy which she had a little over a year ago. She also states her brother had an MI approximately 3 years ago. Denies SOB, consumption of spicy or new foods, lower abdominal pain, diarrhea, constipation, or any urinary symptoms. Additionally denies taking an oral estrogen medication, recent long travels or surgery, or a PMhx of cardiomyopathies. Reports allergies to ampicillin, codene, and albuterol steroids.    Past Medical History  Diagnosis Date  . Hyperlipidemia   . Hypertension   . Thyroid disease   . GERD (gastroesophageal reflux disease)   . Asthma    Past Surgical History  Procedure Laterality Date  . Fracture surgery    . Abdominal hysterectomy    . Appendectomy    . Tonsillectomy    . Cholecystectomy     No family history on file. History  Substance Use Topics  . Smoking status: Never Smoker   . Smokeless tobacco: Not on file  . Alcohol Use: No   OB History    No data available     Review of Systems  Constitutional: Negative for  fever.  Respiratory: Negative for shortness of breath.   Cardiovascular: Positive for chest pain. Negative for leg swelling.  Gastrointestinal: Positive for nausea. Negative for vomiting, abdominal pain, diarrhea and constipation.  Genitourinary: Negative for dysuria, urgency and frequency.  All other systems reviewed and are negative.  Allergies  Amoxil  Home Medications   Prior to Admission medications   Medication Sig Start Date End Date Taking? Authorizing Provider  ezetimibe (ZETIA) 10 MG tablet Take 10 mg by mouth daily.   Yes Historical Provider, MD  albuterol (PROVENTIL) (2.5 MG/3ML) 0.083% nebulizer solution Take 3 mLs (2.5 mg total) by nebulization every 6 (six) hours as needed for wheezing or shortness of breath. 05/08/14   Jerelyn ScottMartha Linker, MD  amLODipine-benazepril (LOTREL) 10-20 MG per capsule Take 1 capsule by mouth daily.    Historical Provider, MD  ranitidine (ZANTAC) 150 MG tablet Take 1 tablet (150 mg total) by mouth 2 (two) times daily. 11/25/14   Marily MemosJason Jonica Bickhart, MD  sertraline (ZOLOFT) 100 MG tablet Take 100 mg by mouth daily.    Historical Provider, MD   BP 156/82 mmHg  Pulse 72  Temp(Src) 98.2 F (36.8 C) (Oral)  Resp 18  Ht 5' (1.524 m)  Wt 140 lb (63.504 kg)  BMI 27.34 kg/m2  SpO2 100% Physical Exam  Constitutional: She appears well-developed and well-nourished. No distress.  HENT:  Head: Normocephalic and atraumatic.  Eyes: Conjunctivae are normal. Right eye exhibits  no discharge. Left eye exhibits no discharge. No scleral icterus.  Neck: Normal range of motion. Neck supple. No JVD present.  Cardiovascular: Normal rate, regular rhythm and normal heart sounds.   Pulmonary/Chest: Effort normal and breath sounds normal. No respiratory distress. She has no wheezes.  Abdominal: Soft. Bowel sounds are normal. She exhibits no distension. There is no tenderness. There is no rebound and no guarding.  Neurological: She is alert. Coordination normal.  Skin: Skin is warm.  No rash noted. No erythema. No pallor.  Psychiatric: She has a normal mood and affect. Her behavior is normal.  Nursing note and vitals reviewed.   ED Course  Procedures  DIAGNOSTIC STUDIES: Oxygen Saturation is 100% on RA, normal by my interpretation.    COORDINATION OF CARE: 6:24 PM Discussed treatment plan with pt. Diagnostic labs and imaging ordered. Pt acknowledges and agrees to plan.   Labs Review Labs Reviewed  COMPREHENSIVE METABOLIC PANEL - Abnormal; Notable for the following:    Glucose, Bld 102 (*)    All other components within normal limits  CBC  TROPONIN I  D-DIMER, QUANTITATIVE (NOT AT [facility])  LIPASE, BLOOD  TROPONIN I    Imaging Review Dg Chest 2 View  11/25/2014   CLINICAL DATA:  Acute mid chest pain for 1 day  EXAM: CHEST  2 VIEW  COMPARISON:  05/08/2014  FINDINGS: The heart size and mediastinal contours are within normal limits. Both lungs are clear. The visualized skeletal structures are unremarkable. Chronic degenerative changes of the spine. Prior cholecystectomy noted. Stable exam. No significant interval change.  IMPRESSION: No active cardiopulmonary disease.   Electronically Signed   By: Judie Petit.  Shick M.D.   On: 11/25/2014 19:38     EKG Interpretation   Date/Time:  Wednesday November 25 2014 18:06:12 EDT Ventricular Rate:  71 PR Interval:  156 QRS Duration: 86 QT Interval:  370 QTC Calculation: 402 R Axis:   -28 Text Interpretation:  Normal sinus rhythm Nonspecific ST and T wave  abnormality Abnormal ECG No significant change since last tracing  Confirmed by Ethelda Chick  MD, SAM [phone]) on 11/25/2014 6:24:57 PM      MDM   Final diagnoses:  Chest pain, unspecified chest pain type   74 year old female with a history of reflux presents to the emergency department today with congestion for approximately 6 hours prior to arrival. Tried Tums and Prilosec without relief at home. Some nausea but no vomiting no shortness of breath diaphoresis lightheadedness.  He did have oxygen saturations in the 93-94 range. No swelling in her legs. Initial differential included ACS versus pulmonary bolus versus indigestion. D-dimer is negative she is low risk for ACS so delta troponin was done and both were negative. EKG without evidence of ischemia. Labs checked to evaluate for possible retained stone that she had a cholecystectomy recently and no liver enzyme elevation. Lipase also negative so I doubt pancreatitis. Symptoms improved with a GI cocktail. Patient will follow with her primary doctor for further evaluation of indigestion versus ACS and possible stress testing.  I have personally and contemperaneously reviewed labs and imaging and used in my decision making as above.   I feel the patient has had an appropriate workup for their chief complaint at this time and likelihood of emergent condition existing is low. They have been counseled on decision, discharge, follow up and which symptoms necessitate immediate return to the emergency department. They or their family verbally stated understanding and agreement with plan and discharged in stable condition.  I personally performed the services described in this documentation, which was scribed in my presence. The recorded information has been reviewed and is accurate.Marily Memos, MD 11/25/14 [id number]  Marily Memos, MD 11/25/14 2132

## 2014-11-25 NOTE — ED Notes (Signed)
CP that feels like heartburn since 10am

## 2017-09-12 ENCOUNTER — Encounter (HOSPITAL_BASED_OUTPATIENT_CLINIC_OR_DEPARTMENT_OTHER): Payer: Self-pay | Admitting: Emergency Medicine

## 2017-09-12 ENCOUNTER — Emergency Department (HOSPITAL_BASED_OUTPATIENT_CLINIC_OR_DEPARTMENT_OTHER): Payer: Medicare Other

## 2017-09-12 ENCOUNTER — Other Ambulatory Visit: Payer: Self-pay

## 2017-09-12 ENCOUNTER — Emergency Department (HOSPITAL_BASED_OUTPATIENT_CLINIC_OR_DEPARTMENT_OTHER)
Admission: EM | Admit: 2017-09-12 | Discharge: 2017-09-12 | Disposition: A | Discharge status: Home / Self Care | Payer: Medicare Other | Attending: Emergency Medicine | Admitting: Emergency Medicine

## 2017-09-12 DIAGNOSIS — J9801 Acute bronchospasm: Secondary | ICD-10-CM | POA: Insufficient documentation

## 2017-09-12 DIAGNOSIS — E079 Disorder of thyroid, unspecified: Secondary | ICD-10-CM | POA: Insufficient documentation

## 2017-09-12 DIAGNOSIS — J45909 Unspecified asthma, uncomplicated: Secondary | ICD-10-CM | POA: Diagnosis not present

## 2017-09-12 DIAGNOSIS — I10 Essential (primary) hypertension: Secondary | ICD-10-CM | POA: Insufficient documentation

## 2017-09-12 DIAGNOSIS — K219 Gastro-esophageal reflux disease without esophagitis: Secondary | ICD-10-CM

## 2017-09-12 DIAGNOSIS — Z79899 Other long term (current) drug therapy: Secondary | ICD-10-CM | POA: Insufficient documentation

## 2017-09-12 DIAGNOSIS — R062 Wheezing: Secondary | ICD-10-CM | POA: Diagnosis not present

## 2017-09-12 DIAGNOSIS — R05 Cough: Secondary | ICD-10-CM | POA: Insufficient documentation

## 2017-09-12 DIAGNOSIS — R0602 Shortness of breath: Secondary | ICD-10-CM | POA: Diagnosis present

## 2017-09-12 MED ORDER — ALBUTEROL SULFATE (2.5 MG/3ML) 0.083% IN NEBU: 2.5000 mg | INHALATION_SOLUTION | Freq: Four times a day (QID) | RESPIRATORY_TRACT | 0 refills | Status: AC | PRN

## 2017-09-12 MED ORDER — METHYLPREDNISOLONE SODIUM SUCC 125 MG IJ SOLR: 125.0000 mg | Freq: Once | INTRAMUSCULAR | Status: DC

## 2017-09-12 MED ORDER — SUCRALFATE 1 G PO TABS: 1.0000 g | ORAL_TABLET | Freq: Three times a day (TID) | ORAL | 0 refills | Status: DC

## 2017-09-12 MED ORDER — ALBUTEROL SULFATE (2.5 MG/3ML) 0.083% IN NEBU: 5.0000 mg | INHALATION_SOLUTION | Freq: Once | RESPIRATORY_TRACT | Status: DC

## 2017-09-12 MED ORDER — BENZONATATE 100 MG PO CAPS: 100.0000 mg | ORAL_CAPSULE | Freq: Three times a day (TID) | ORAL | 0 refills | Status: DC

## 2017-09-12 MED ORDER — PREDNISONE 20 MG PO TABS: ORAL_TABLET | ORAL | 0 refills | Status: DC

## 2017-09-12 NOTE — ED Provider Notes (Signed)
[facility] EMERGENCY DEPARTMENT Provider Note   CSN: [id number] Arrival date & time: 09/12/17  4098     History   Chief Complaint Chief Complaint  Patient presents with  . Shortness of Breath    HPI Anne Soto is a 77 y.o. female.  HPI Patient with history of asthma presents with wheezing and nonproductive cough worsening over the last few weeks.  States that she had "bad acid reflux" before onset of symptoms.  She is on Protonix and Zantac daily.  She has an appointment to follow-up with gastroenterologist for upper endoscopy.  States she continues to have metallic taste in the back of her throat.  No fever or chills.  Denies any chest pain, lower extremity swelling or pain. Past Medical History:  Diagnosis Date  . Asthma   . GERD (gastroesophageal reflux disease)   . Hyperlipidemia   . Hypertension   . Thyroid disease     There are no active problems to display for this patient.   Past Surgical History:  Procedure Laterality Date  . ABDOMINAL HYSTERECTOMY    . APPENDECTOMY    . CHOLECYSTECTOMY    . FRACTURE SURGERY    . TONSILLECTOMY       OB History   None      Home Medications    Prior to Admission medications   Medication Sig Start Date End Date Taking? Authorizing Provider  albuterol (PROVENTIL) (2.5 MG/3ML) 0.083% nebulizer solution Take 3 mLs (2.5 mg total) by nebulization every 6 (six) hours as needed for wheezing or shortness of breath. 09/12/17   Loren Racer, MD  amLODipine-benazepril (LOTREL) 10-20 MG per capsule Take 1 capsule by mouth daily.    [provider]  benzonatate (TESSALON) 100 MG capsule Take 1 capsule (100 mg total) by mouth every 8 (eight) hours. 09/12/17   Loren Racer, MD  ezetimibe (ZETIA) 10 MG tablet Take 10 mg by mouth daily.    [provider]  predniSONE (DELTASONE) 20 MG tablet 3 tabs po day one, then 2 tabs daily x 4 days 09/12/17   Loren Racer, MD  ranitidine (ZANTAC) 150 MG tablet  Take 1 tablet (150 mg total) by mouth 2 (two) times daily. 11/25/14   Mesner, Avi Cower, MD  sertraline (ZOLOFT) 100 MG tablet Take 100 mg by mouth daily.    [provider]  sucralfate (CARAFATE) 1 g tablet Take 1 tablet (1 g total) by mouth 4 (four) times daily -  with meals and at bedtime. 09/12/17   Loren Racer, MD    Family History No family history on file.  Social History Social History   Tobacco Use  . Smoking status: Never Smoker  Substance Use Topics  . Alcohol use: No  . Drug use: No     Allergies   Amoxil [amoxicillin]   Review of Systems Review of Systems  Constitutional: Negative for chills and fever.  HENT: Negative for sore throat and trouble swallowing.   Respiratory: Positive for cough and wheezing. Negative for chest tightness and shortness of breath.   Cardiovascular: Negative for chest pain and palpitations.  Gastrointestinal: Negative for abdominal pain, constipation, diarrhea, nausea and vomiting.  Musculoskeletal: Negative for back pain, myalgias and neck pain.  Skin: Negative for rash and wound.  Neurological: Negative for dizziness, weakness, light-headedness, numbness and headaches.  All other systems reviewed and are negative.    Physical Exam Updated Vital Signs BP 126/82 (BP Location: Left Arm)   Pulse 69   Temp  97.8 F (36.6 C) (Oral)   Resp 17   Ht  (1.549 m)   Wt 60.3 kg (133 lb)   SpO2 100%   BMI 25.13 kg/m   Physical Exam  Constitutional: She is oriented to person, place, and time. She appears well-developed and well-nourished. No distress.  HENT:  Head: Normocephalic and atraumatic.  Mouth/Throat: Oropharynx is clear and moist. No oropharyngeal exudate.  Eyes: Pupils are equal, round, and reactive to light. EOM are normal.  Neck: Normal range of motion. Neck supple.  No stridor  Cardiovascular: Normal rate and regular rhythm. Exam reveals no gallop and no friction rub.  No murmur heard. Pulmonary/Chest: Effort  normal and breath sounds normal.  Prolonged expiratory phase  Abdominal: Soft. Bowel sounds are normal. There is no tenderness. There is no rebound and no guarding.  Musculoskeletal: Normal range of motion. She exhibits no edema or tenderness.  No lower extremity swelling, asymmetry or tenderness.  Distal pulses intact.  Lymphadenopathy:    She has no cervical adenopathy.  Neurological: She is alert and oriented to person, place, and time.  Moves all extremities without focal deficit.  Sensation fully intact.  Skin: Skin is warm and dry. No rash noted. She is not diaphoretic. No erythema.  Psychiatric: She has a normal mood and affect. Her behavior is normal.  Nursing note and vitals reviewed.    ED Treatments / Results  Labs (all labs ordered are listed, but only abnormal results are displayed) Labs Reviewed - No data to display  EKG EKG Interpretation  Date/Time:  Wednesday Sep 12 2017 09:28:11 EDT Ventricular Rate:  68 PR Interval:  160 QRS Duration: 88 QT Interval:  404 QTC Calculation: 429 R Axis:   -26 Text Interpretation:  Normal sinus rhythm Normal ECG Confirmed by Loren Racer ([id number]) on 09/12/2017 12:29:40 PM   Radiology Dg Chest 2 View  Result Date: 09/12/2017 CLINICAL DATA:  Asthma.  Cough.  Shortness of breath for 3 weeks. EXAM: CHEST - 2 VIEW COMPARISON:  11/25/2014 FINDINGS: The heart size and mediastinal contours are within normal limits. Both lungs are clear. No pleural effusion or pneumothorax. The visualized skeletal structures are unremarkable. IMPRESSION: No active cardiopulmonary disease. Electronically Signed   By: Amie Portland M.D.   On: 09/12/2017 10:02    Procedures Procedures (including critical care time)  Medications Ordered in ED Medications - No data to display   Initial Impression / Assessment and Plan / ED Course  I have reviewed the triage vital signs and the nursing notes.  Pertinent labs & imaging results that were available  during my care of the patient were reviewed by me and considered in my medical decision making (see chart for details).     Patient presents with ongoing wheezing and nonproductive cough.  Possibly related to seasonal allergies, viral illness or aspiration from gastroesophageal reflux disease.  Give short course of steroids and refill albuterol solution for her nebulizer at home.  Advised to continue Protonix and Zantac and will also give prescription for Carafate.  Return precautions given.  Final Clinical Impressions(s) / ED Diagnoses   Final diagnoses:  Bronchospasm  Gastroesophageal reflux disease, esophagitis presence not specified    ED Discharge Orders        Ordered    albuterol (PROVENTIL) (2.5 MG/3ML) 0.083% nebulizer solution  Every 6 hours PRN     09/12/17 1222    predniSONE (DELTASONE) 20 MG tablet     09/12/17 1222    sucralfate (CARAFATE)  1 g tablet  3 times daily with meals & bedtime     09/12/17 1222    benzonatate (TESSALON) 100 MG capsule  Every 8 hours     09/12/17 1222       Loren Racer, MD 09/12/17 1230

## 2017-09-12 NOTE — ED Notes (Signed)
Went in to start an IV and pt advised she needs to leave because the repair man Is coming to fix her a/c. EDP made aware.

## 2017-09-12 NOTE — ED Triage Notes (Signed)
PT presents with c/o shortness of breath for 3 weeks but worse today . Pt state she was out of town when this started. Pt has history of ashma.

## 2017-10-03 ENCOUNTER — Ambulatory Visit: Payer: Self-pay | Admitting: Allergy and Immunology

## 2017-10-08 ENCOUNTER — Ambulatory Visit (INDEPENDENT_AMBULATORY_CARE_PROVIDER_SITE_OTHER): Payer: Medicare Other | Admitting: Allergy & Immunology

## 2017-10-08 ENCOUNTER — Encounter: Payer: Self-pay | Admitting: Allergy & Immunology

## 2017-10-08 VITALS — BP 146/77 | HR 78 | Temp 97.6°F | Resp 16 | Ht 59.5 in | Wt 132.2 lb

## 2017-10-08 DIAGNOSIS — J4540 Moderate persistent asthma, uncomplicated: Secondary | ICD-10-CM | POA: Diagnosis not present

## 2017-10-08 DIAGNOSIS — J302 Other seasonal allergic rhinitis: Secondary | ICD-10-CM | POA: Diagnosis not present

## 2017-10-08 DIAGNOSIS — J3089 Other allergic rhinitis: Secondary | ICD-10-CM | POA: Diagnosis not present

## 2017-10-08 DIAGNOSIS — K219 Gastro-esophageal reflux disease without esophagitis: Secondary | ICD-10-CM | POA: Diagnosis not present

## 2017-10-08 MED ORDER — AZELASTINE HCL 0.1 % NA SOLN: 2.0000 | Freq: Two times a day (BID) | NASAL | 1 refills | Status: DC

## 2017-10-08 MED ORDER — BUDESONIDE-FORMOTEROL FUMARATE 160-4.5 MCG/ACT IN AERO: 2.0000 | INHALATION_SPRAY | Freq: Two times a day (BID) | RESPIRATORY_TRACT | 1 refills | Status: DC

## 2017-10-08 MED ORDER — HYDROCOD POLST-CPM POLST ER 10-8 MG/5ML PO SUER: 5.0000 mL | Freq: Two times a day (BID) | ORAL | 0 refills | Status: AC | PRN

## 2017-10-08 NOTE — Patient Instructions (Addendum)
1. Moderate persistent asthma, uncomplicated - Lung testing looked fairly normal today. - Given your frequency of symptoms, we will start a daily controller medication: Symbicort - Prescription sent in for Tussionex twice daily for two weeks (this will allow you to get some sleep while the Symbicort starts working) - Spacer sample and demonstration provided. - Daily controller medication(s): Singulair 10mg  daily and Symbicort 160/4.705mcg two puffs twice daily with spacer - Prior to physical activity: ProAir 2 puffs 10-15 minutes before physical activity. - Rescue medications: ProAir 4 puffs every 4-6 hours as needed or DuoNeb nebulizer one vial every 4-6 hours as needed - Asthma control goals:  * Full participation in all desired activities (may need albuterol before activity) * Albuterol use two time or less a week on average (not counting use with activity) * Cough interfering with sleep two time or less a month * Oral steroids no more than once a year * No hospitalizations  2. Chronic rhinitis - Testing today showed: trees, weeds, grasses, indoor molds, outdoor molds, dust mites and cat - Avoidance measures provided. - Continue with: Zyrtec (cetirizine) 10mg  tablet once daily and Singulair (montelukast) 10mg  daily - Start taking: Astelin (azelastine) 2 sprays per nostril 1-2 times daily as needed - You can use an extra dose of the antihistamine, if needed, for breakthrough symptoms.  - Consider nasal saline rinses 1-2 times daily to remove allergens from the nasal cavities as well as help with mucous clearance (this is especially helpful to do before the nasal sprays are given) - Consider allergy shots as a means of long-term control. - Allergy shots "re-train" and "reset" the immune system to ignore environmental allergens and decrease the resulting immune response to those allergens (sneezing, itchy watery eyes, runny nose, nasal congestion, etc).    - Allergy shots improve symptoms in  75-85% of patients.  - We can discuss more at the next appointment if the medications are not working for you.  3. Gastroesophageal reflux disease - Continue with your proton pump inhibitor.  - I agree with the endoscopy to see if there is anything else we need to do to control your reflux.   4. Return in about 6 weeks (around 11/19/2017).   Please inform us of any Emergency Department visits, hospitalizations, or changes in symptoms. Call us before going to the ED for breathing or allergy symptoms since we might be able to fit you in for a sick visit. Feel free to contact us anytime with any questions, problems, or concerns.  It was a pleasure to meet you today!  Websites that have reliable patient information: 1. American Academy of Asthma, Allergy, and Immunology: www.aaaai.org 2. Food Allergy Research and Education (FARE): foodallergy.org 3. Mothers of Asthmatics: http://www.asthmacommunitynetwork.org 4. American College of Allergy, Asthma, and Immunology: MissingWeapons.cawww.acaai.org   Make sure you are registered to vote!

## 2017-10-08 NOTE — Progress Notes (Signed)
NEW PATIENT  Date of Service/Encounter:  10/08/17  Referring provider: Cheron Schaumann., MD   Assessment:   Moderate persistent asthma, uncomplicated   Seasonal and perennial allergic rhinitis (trees, weeds, grasses, indoor molds, outdoor molds, dust mites and cat)  Gastroesophageal reflux disease  Plan/Recommendations:   1. Moderate persistent asthma, uncomplicated - Lung testing looked fairly normal today. - Given your frequency of symptoms, we will start a daily controller medication: Symbicort - Prescription sent in for Tussionex twice daily for two weeks (this will allow you to get some sleep while the Symbicort starts working) - Spacer sample and demonstration provided. - Daily controller medication(s): Singulair 10mg  daily and Symbicort 160/4.81mcg two puffs twice daily with spacer - Prior to physical activity: ProAir 2 puffs 10-15 minutes before physical activity. - Rescue medications: ProAir 4 puffs every 4-6 hours as needed or DuoNeb nebulizer one vial every 4-6 hours as needed - Asthma control goals:  * Full participation in all desired activities (may need albuterol before activity) * Albuterol use two time or less a week on average (not counting use with activity) * Cough interfering with sleep two time or less a month * Oral steroids no more than once a year * No hospitalizations  2. Chronic rhinitis - Testing today showed: trees, weeds, grasses, indoor molds, outdoor molds, dust mites and cat - Avoidance measures provided. - Continue with: Zyrtec (cetirizine) 10mg  tablet once daily and Singulair (montelukast) 10mg  daily - Start taking: Astelin (azelastine) 2 sprays per nostril 1-2 times daily as needed - You can use an extra dose of the antihistamine, if needed, for breakthrough symptoms.  - Consider nasal saline rinses 1-2 times daily to remove allergens from the nasal cavities as well as help with mucous clearance (this is especially helpful to do  before the nasal sprays are given) - Consider allergy shots as a means of long-term control. - Allergy shots "re-train" and "reset" the immune system to ignore environmental allergens and decrease the resulting immune response to those allergens (sneezing, itchy watery eyes, runny nose, nasal congestion, etc).    - Allergy shots improve symptoms in 75-85% of patients.  - We can discuss more at the next appointment if the medications are not working for you.  3. Gastroesophageal reflux disease - Continue with your proton pump inhibitor.  - I agree with the endoscopy to see if there is anything else we need to do to control your reflux.   4. Return in about 6 weeks (around 11/19/2017).    Subjective:   Anne Soto is a 77 y.o. female presenting today for evaluation of  Chief Complaint  Patient presents with  . Asthma    Become worse over the last 2 years   . Cough    Chronic cough   . Bronchitis    x-ray shows no signs of pneumonia     Anne Soto has a history of the following: Patient Active Problem List   Diagnosis Date Noted  . Seasonal and perennial allergic rhinitis 10/09/2017  . Moderate persistent asthma, uncomplicated 10/09/2017  . Gastroesophageal reflux disease 10/09/2017    History obtained from: chart review and patient.  Lind Guest was referred by Cheron Schaumann., MD.     Anne Soto is a 77 y.o. female presenting for evaluation of a chronic cough. She was followed in the past by Dr. Lucie Leather. She was well controlled until this past February 2019.   She has had bronchitis since March but it  has not gotten better. She went to the beach in April and had another flare. She was placed on prednisone which has helped. She had a CXR that was notable for no pneumonia. She has a chronic cough and did have an CXR which was negative last week. It seems that she has been diagnosed with multiple episodes of bronchitis. She did complete a course of prednisone  and doxycycline. She was also switched from albuterol to DuoNeb instead; this is providing some relief. Cough has been productive of green sputum but it has cleared over the last few days.   She continues to report wheezing at night with decreased sleep which has improved over the past week. She is on Nexium for GERD. She is scheduled for an endoscopy.   Anne Soto does have intermittent sinus problems with some current left ear discomfort. It is obscured from wax today. She takes montelukast and cetirizine. She is not on a nose spray at this time.   Otherwise, there is no history of other atopic diseases, including drug allergies, food allergies, stinging insect allergies, or urticaria. There is no significant infectious history. Vaccinations are up to date.    Past Medical History: Patient Active Problem List   Diagnosis Date Noted  . Seasonal and perennial allergic rhinitis 10/09/2017  . Moderate persistent asthma, uncomplicated 10/09/2017  . Gastroesophageal reflux disease 10/09/2017    Medication List:  Allergies as of 10/08/2017      Reactions   Statins Other (See Comments)   Gait Disturbance    Amoxil [amoxicillin] Hives      Medication List        Accurate as of 10/08/17 11:59 PM. Always use your most recent med list.          albuterol (2.5 MG/3ML) 0.083% nebulizer solution Commonly known as:  PROVENTIL Take 3 mLs (2.5 mg total) by nebulization every 6 (six) hours as needed for wheezing or shortness of breath.   amLODipine-benazepril 10-20 MG capsule Commonly known as:  LOTREL Take 1 capsule by mouth daily.   azelastine 0.1 % nasal spray Commonly known as:  ASTELIN Place 2 sprays into both nostrils 2 (two) times daily.   benzonatate 100 MG capsule Commonly known as:  TESSALON Take 1 capsule (100 mg total) by mouth every 8 (eight) hours.   budesonide-formoterol 160-4.5 MCG/ACT inhaler Commonly known as:  SYMBICORT Inhale 2 puffs into the lungs 2 (two) times  daily.   calcium-vitamin D 250-125 MG-UNIT tablet Commonly known as:  OSCAL WITH D Take 1 tablet by mouth daily.   cetirizine 10 MG tablet Commonly known as:  ZYRTEC Take 10 mg by mouth daily.   chlorpheniramine-HYDROcodone 10-8 MG/5ML Suer Commonly known as:  TUSSIONEX PENNKINETIC ER Take 5 mLs by mouth every 12 (twelve) hours as needed for up to 14 days for cough.   esomeprazole 40 MG capsule Commonly known as:  NEXIUM Take 40 mg by mouth 2 (two) times daily before a meal.   ezetimibe 10 MG tablet Commonly known as:  ZETIA Take 10 mg by mouth daily.   HYDROcodone-acetaminophen 5-325 MG tablet Commonly known as:  NORCO/VICODIN Take 1 tablet by mouth every 12 (twelve) hours.   hydrOXYzine 25 MG tablet Commonly known as:  ATARAX/VISTARIL Take 25 mg by mouth once as needed.   levothyroxine 112 MCG tablet Commonly known as:  SYNTHROID, LEVOTHROID Take 112 mcg by mouth daily before breakfast.   meloxicam 7.5 MG tablet Commonly known as:  MOBIC Take 7.5 mg by mouth  daily.   montelukast 10 MG tablet Commonly known as:  SINGULAIR Take 10 mg by mouth at bedtime.   predniSONE 20 MG tablet Commonly known as:  DELTASONE 3 tabs po day one, then 2 tabs daily x 4 days   ranitidine 150 MG tablet Commonly known as:  ZANTAC Take 1 tablet (150 mg total) by mouth 2 (two) times daily.   sertraline 100 MG tablet Commonly known as:  ZOLOFT Take 100 mg by mouth daily.   sucralfate 1 g tablet Commonly known as:  CARAFATE Take 1 tablet (1 g total) by mouth 4 (four) times daily -  with meals and at bedtime.   vitamin E 1000 UNIT capsule Take 1,000 Units by mouth daily.       Birth History: non-contributory.  Developmental History: non-contributory.   Past Surgical History: Past Surgical History:  Procedure Laterality Date  . ABDOMINAL HYSTERECTOMY    . ADENOIDECTOMY    . APPENDECTOMY    . CHOLECYSTECTOMY    . FRACTURE SURGERY    . TONSILLECTOMY       Family  History: Family History  Problem Relation Age of Onset  . Diabetes Mother   . Hyperlipidemia Mother   . Cancer Father   . Hyperlipidemia Brother   . Asthma Paternal Grandmother      Social History: Britta MccreedyBarbara lives at home with her dog. She is recently widowed as of one year ago. She lives in a house that is 77 years old. There is wood flooring in the main living areas and carpeting in the bedrooms. She has gas heating and central cooling. She does have one small dog. There are no dust mite coverings on the bedding and no tobacco exposure in the home. She does keep busy with her grandchildren, a few of whom live locally.      Review of Systems: a 14-point review of systems is pertinent for what is mentioned in HPI.  Otherwise, all other systems were negative. Constitutional: negative other than that listed in the HPI Eyes: negative other than that listed in the HPI Ears, nose, mouth, throat, and face: negative other than that listed in the HPI Respiratory: negative other than that listed in the HPI Cardiovascular: negative other than that listed in the HPI Gastrointestinal: negative other than that listed in the HPI Genitourinary: negative other than that listed in the HPI Integument: negative other than that listed in the HPI Hematologic: negative other than that listed in the HPI Musculoskeletal: negative other than that listed in the HPI Neurological: negative other than that listed in the HPI Allergy/Immunologic: negative other than that listed in the HPI    Objective:   Blood pressure (!) 146/77, pulse 78, temperature 97.6 F (36.4 C), temperature source Oral, resp. rate 16, height 4' 11.5" (1.511 m), weight 132 lb 3.2 oz (60 kg), SpO2 98 %. Body mass index is 26.25 kg/m.   Physical Exam:  General: Alert, interactive, in no acute distress. Pleasant female. Talkative.  Eyes: No conjunctival injection bilaterally, no discharge on the right, no discharge on the left, no  Horner-Trantas dots present and allergic shiners present bilaterally. PERRL bilaterally. EOMI without pain. No photophobia.  Ears: Right TM pearly gray with normal light reflex, Left TM pearly gray with normal light reflex, Right TM intact without perforation and Left TM intact without perforation.  Nose/Throat: External nose within normal limits, nasal crease present and septum midline. Turbinates markedly edematous with clear discharge. Posterior oropharynx erythematous without cobblestoning in the posterior  oropharynx. Tonsils 2+ without exudates.  Tongue without thrush. Neck: Supple without thyromegaly. Trachea midline. Adenopathy: no enlarged lymph nodes appreciated in the anterior cervical, occipital, axillary, epitrochlear, inguinal, or popliteal regions. Lungs: Clear to auscultation without wheezing, rhonchi or rales. No increased work of breathing. CV: Normal S1/S2. No murmurs. Capillary refill <2 seconds.  Abdomen: Nondistended, nontender. No guarding or rebound tenderness. Bowel sounds present in all fields and hyperactive  Skin: Warm and dry, without lesions or rashes. Extremities:  No clubbing, cyanosis or edema. Neuro:   Grossly intact. No focal deficits appreciated. Responsive to questions.  Diagnostic studies:   Spirometry: results normal (FEV1: 1.92/114%, FVC: 2.23/98%, FEV1/FVC: 86%) .    Spirometry consistent with normal pattern.   Allergy Studies:   Indoor/Outdoor Percutaneous Adult Environmental Panel: negative to the entire panel with adequate controls.  Indoor/Outdoor Selected Intradermal Environmental Panel: positive to Johnson grass, weed mix, tree mix, mold mix #1, mold mix #3, mold mix #4, cat and mite mix. Otherwise negative with adequate controls.  Allergy testing results were read and interpreted by myself, documented by clinical staff.       Malachi Bonds, MD [facility]

## 2017-10-09 ENCOUNTER — Encounter: Payer: Self-pay | Admitting: Allergy & Immunology

## 2017-10-09 DIAGNOSIS — J3089 Other allergic rhinitis: Secondary | ICD-10-CM

## 2017-10-09 DIAGNOSIS — J302 Other seasonal allergic rhinitis: Secondary | ICD-10-CM | POA: Insufficient documentation

## 2017-10-09 DIAGNOSIS — J4540 Moderate persistent asthma, uncomplicated: Secondary | ICD-10-CM | POA: Insufficient documentation

## 2017-10-09 DIAGNOSIS — K219 Gastro-esophageal reflux disease without esophagitis: Secondary | ICD-10-CM | POA: Insufficient documentation

## 2017-10-17 ENCOUNTER — Ambulatory Visit: Payer: Self-pay | Admitting: Allergy and Immunology

## 2017-11-01 ENCOUNTER — Other Ambulatory Visit: Payer: Self-pay | Admitting: Allergy & Immunology

## 2017-11-01 NOTE — Telephone Encounter (Signed)
RF on azelastine and Symbicort 160 mcg x 1 with 5 refills at [facility]

## 2017-11-19 ENCOUNTER — Ambulatory Visit: Payer: Medicare Other | Admitting: Allergy & Immunology

## 2019-01-04 ENCOUNTER — Encounter (HOSPITAL_BASED_OUTPATIENT_CLINIC_OR_DEPARTMENT_OTHER): Payer: Self-pay | Admitting: Emergency Medicine

## 2019-01-04 ENCOUNTER — Emergency Department (HOSPITAL_BASED_OUTPATIENT_CLINIC_OR_DEPARTMENT_OTHER): Payer: Medicare Other

## 2019-01-04 ENCOUNTER — Emergency Department (HOSPITAL_BASED_OUTPATIENT_CLINIC_OR_DEPARTMENT_OTHER)
Admission: EM | Admit: 2019-01-04 | Discharge: 2019-01-04 | Disposition: A | Discharge status: Home / Self Care | Payer: Medicare Other | Attending: Emergency Medicine | Admitting: Emergency Medicine

## 2019-01-04 ENCOUNTER — Other Ambulatory Visit: Payer: Self-pay

## 2019-01-04 DIAGNOSIS — I10 Essential (primary) hypertension: Secondary | ICD-10-CM | POA: Insufficient documentation

## 2019-01-04 DIAGNOSIS — Y999 Unspecified external cause status: Secondary | ICD-10-CM | POA: Insufficient documentation

## 2019-01-04 DIAGNOSIS — J45909 Unspecified asthma, uncomplicated: Secondary | ICD-10-CM | POA: Insufficient documentation

## 2019-01-04 DIAGNOSIS — S99922A Unspecified injury of left foot, initial encounter: Secondary | ICD-10-CM | POA: Diagnosis present

## 2019-01-04 DIAGNOSIS — Y92008 Other place in unspecified non-institutional (private) residence as the place of occurrence of the external cause: Secondary | ICD-10-CM | POA: Diagnosis not present

## 2019-01-04 DIAGNOSIS — S91209A Unspecified open wound of unspecified toe(s) with damage to nail, initial encounter: Secondary | ICD-10-CM

## 2019-01-04 DIAGNOSIS — Y9301 Activity, walking, marching and hiking: Secondary | ICD-10-CM | POA: Insufficient documentation

## 2019-01-04 DIAGNOSIS — Z79899 Other long term (current) drug therapy: Secondary | ICD-10-CM | POA: Diagnosis not present

## 2019-01-04 DIAGNOSIS — W109XXA Fall (on) (from) unspecified stairs and steps, initial encounter: Secondary | ICD-10-CM | POA: Diagnosis not present

## 2019-01-04 DIAGNOSIS — S93402A Sprain of unspecified ligament of left ankle, initial encounter: Secondary | ICD-10-CM

## 2019-01-04 DIAGNOSIS — Z79891 Long term (current) use of opiate analgesic: Secondary | ICD-10-CM | POA: Insufficient documentation

## 2019-01-04 MED ORDER — LIDOCAINE HCL 2 % IJ SOLN
10.0000 mL | Freq: Once | INTRAMUSCULAR | Status: AC
  Administered 2019-01-04: 200 mg
  Filled 2019-01-04: qty 20

## 2019-01-04 NOTE — ED Notes (Signed)
Pt refused wheelchair.  

## 2019-01-04 NOTE — Discharge Instructions (Signed)
You can take down the nail just to prevent it from getting caught on anything.  Use the ankle splint to help with stability so your ankle does not roll.  Tylenol or ibuprofen as needed for pain.

## 2019-01-04 NOTE — ED Notes (Signed)
ED Provider at bedside. 

## 2019-01-04 NOTE — ED Provider Notes (Signed)
[facility] EMERGENCY DEPARTMENT Provider Note   CSN: [id number] Arrival date & time: 01/04/19  1223     History   Chief Complaint Chief Complaint  Patient presents with  . Fall    HPI Anne Soto is a 78 y.o. female.     Patient is a 78 year old female with a history of hypertension, hyperlipidemia, thyroid disease and asthma presenting today after a fall.  Patient states she was walking down her back patio and slipped on the wet wood falling down the steps.  Her toenail caught the edge of a step and partially avulsed her left great toenail.  She is also having pain and swelling of her left ankle.  She denies any head injury, loss of consciousness.  She was able to ambulate.  She did not feel dizzy or lightheaded before the incident or after.  She takes no anticoagulation.  Tetanus shot is up-to-date.  The history is provided by the patient.  Fall This is a new problem. The current episode started 1 to 2 hours ago. The problem occurs constantly. The problem has not changed since onset.Pertinent negatives include no chest pain, no abdominal pain, no headaches and no shortness of breath. She has tried nothing for the symptoms. The treatment provided no relief.    Past Medical History:  Diagnosis Date  . Asthma   . GERD (gastroesophageal reflux disease)   . Hyperlipidemia   . Hypertension   . Recurrent upper respiratory infection (URI)   . Thyroid disease   . Urticaria     Patient Active Problem List   Diagnosis Date Noted  . Seasonal and perennial allergic rhinitis 10/09/2017  . Moderate persistent asthma, uncomplicated 08/67/6195  . Gastroesophageal reflux disease 10/09/2017    Past Surgical History:  Procedure Laterality Date  . ABDOMINAL HYSTERECTOMY    . ADENOIDECTOMY    . APPENDECTOMY    . CHOLECYSTECTOMY    . FRACTURE SURGERY    . TONSILLECTOMY       OB History   No obstetric history on file.      Home Medications    Prior to  Admission medications   Medication Sig Start Date End Date Taking? Authorizing Provider  albuterol (PROVENTIL) (2.5 MG/3ML) 0.083% nebulizer solution Take 3 mLs (2.5 mg total) by nebulization every 6 (six) hours as needed for wheezing or shortness of breath. 09/12/17   Julianne Rice, MD  amLODipine-benazepril (LOTREL) 10-20 MG per capsule Take 1 capsule by mouth daily.    [provider]  azelastine (ASTELIN) 0.1 % nasal spray PLACE 2 SPRAYS INTO BOTH NOSTRILS 2 (TWO) TIMES DAILY. 11/01/17   Valentina Shaggy, MD  benzonatate (TESSALON) 100 MG capsule Take 1 capsule (100 mg total) by mouth every 8 (eight) hours. Patient not taking: Reported on 10/08/2017 09/12/17   Julianne Rice, MD  calcium-vitamin D (OSCAL WITH D) 250-125 MG-UNIT tablet Take 1 tablet by mouth daily.    [provider]  cetirizine (ZYRTEC) 10 MG tablet Take 10 mg by mouth daily.    [provider]  esomeprazole (NEXIUM) 40 MG capsule Take 40 mg by mouth 2 (two) times daily before a meal.    [provider]  ezetimibe (ZETIA) 10 MG tablet Take 10 mg by mouth daily.    [provider]  HYDROcodone-acetaminophen (NORCO/VICODIN) 5-325 MG tablet Take 1 tablet by mouth every 12 (twelve) hours.    [provider]  hydrOXYzine (ATARAX/VISTARIL) 25 MG tablet Take 25 mg by mouth once  as needed.    [provider]  levothyroxine (SYNTHROID, LEVOTHROID) 112 MCG tablet Take 112 mcg by mouth daily before breakfast.    [provider]  meloxicam (MOBIC) 7.5 MG tablet Take 7.5 mg by mouth daily.    [provider]  montelukast (SINGULAIR) 10 MG tablet Take 10 mg by mouth at bedtime.    [provider]  predniSONE (DELTASONE) 20 MG tablet 3 tabs po day one, then 2 tabs daily x 4 days 09/12/17   Loren Racer, MD  ranitidine (ZANTAC) 150 MG tablet Take 1 tablet (150 mg total) by mouth 2 (two) times daily. 11/25/14   Mesner, Malonie Cower, MD  sertraline (ZOLOFT)  100 MG tablet Take 100 mg by mouth daily.    [provider]  sucralfate (CARAFATE) 1 g tablet Take 1 tablet (1 g total) by mouth 4 (four) times daily -  with meals and at bedtime. Patient not taking: Reported on 10/08/2017 09/12/17   Loren Racer, MD  SYMBICORT 160-4.5 MCG/ACT inhaler TAKE 2 PUFFS BY MOUTH TWICE A DAY 11/01/17   Alfonse Spruce, MD  vitamin E 1000 UNIT capsule Take 1,000 Units by mouth daily.    [provider]    Family History Family History  Problem Relation Age of Onset  . Diabetes Mother   . Hyperlipidemia Mother   . Cancer Father   . Hyperlipidemia Brother   . Asthma Paternal Grandmother     Social History Social History   Tobacco Use  . Smoking status: Never Smoker  . Smokeless tobacco: Never Used  Substance Use Topics  . Alcohol use: No  . Drug use: No     Allergies   Statins and Amoxil [amoxicillin]   Review of Systems Review of Systems  Respiratory: Negative for shortness of breath.   Cardiovascular: Negative for chest pain.  Gastrointestinal: Negative for abdominal pain.  Neurological: Negative for headaches.  All other systems reviewed and are negative.    Physical Exam Updated Vital Signs BP (!) 176/75   Pulse 70   Temp 98.6 F (37 C) (Oral)   Resp 18   Ht 5\' 1"  (1.549 m)   Wt 59 kg   SpO2 100%   BMI 24.56 kg/m   Physical Exam Vitals signs and nursing note reviewed.  Constitutional:      General: She is not in acute distress.    Appearance: Normal appearance. She is normal weight.  HENT:     Head: Normocephalic and atraumatic.     Nose: Nose normal.  Eyes:     Pupils: Pupils are equal, round, and reactive to light.  Neck:     Musculoskeletal: Normal range of motion.  Cardiovascular:     Rate and Rhythm: Normal rate.     Pulses: Normal pulses.  Pulmonary:     Effort: Pulmonary effort is normal.  Abdominal:     General: Abdomen is flat.  Musculoskeletal:        General: Swelling and  tenderness present.       Legs:     Left foot: Decreased range of motion. Tenderness, bony tenderness and swelling present. No deformity or laceration.       Feet:  Skin:    General: Skin is warm and dry.     Capillary Refill: Capillary refill takes less than 2 seconds.  Neurological:     General: No focal deficit present.     Mental Status: She is alert. Mental status is at baseline.  Psychiatric:  Mood and Affect: Mood normal.        Behavior: Behavior normal.        Thought Content: Thought content normal.      ED Treatments / Results  Labs (all labs ordered are listed, but only abnormal results are displayed) Labs Reviewed - No data to display  EKG None  Radiology Dg Ankle Complete Left  Result Date: 01/04/2019 CLINICAL DATA:  78 year old female recently slipped on porch. Pain and swelling along the lateral ankle and injury to the great toe nail bed. EXAM: LEFT ANKLE COMPLETE - 3+ VIEW; LEFT FOOT - COMPLETE 3+ VIEW COMPARISON:  Concurrently obtained radiographs of the foot FINDINGS: There is no evidence of fracture, dislocation, or joint effusion. There is no evidence of arthropathy or other focal bone abnormality. Soft tissues are unremarkable. IMPRESSION: Negative. Electronically Signed   By: Malachy MoanHeath  McCullough M.D.   On: 01/04/2019 13:09   Dg Foot Complete Left  Result Date: 01/04/2019 CLINICAL DATA:  78 year old female recently slipped on porch. Pain and swelling along the lateral ankle and injury to the great toe nail bed. EXAM: LEFT ANKLE COMPLETE - 3+ VIEW; LEFT FOOT - COMPLETE 3+ VIEW COMPARISON:  Concurrently obtained radiographs of the foot FINDINGS: There is no evidence of fracture, dislocation, or joint effusion. There is no evidence of arthropathy or other focal bone abnormality. Soft tissues are unremarkable. IMPRESSION: Negative. Electronically Signed   By: Malachy MoanHeath  McCullough M.D.   On: 01/04/2019 13:09    Procedures .Nail Removal  Date/Time: 01/04/2019  5:04 PM Performed by: Gwyneth SproutPlunkett, Jhace Fennell, MD Authorized by: Gwyneth SproutPlunkett, Carrissa Taitano, MD   Consent:    Consent obtained:  Verbal   Consent given by:  Patient   Risks discussed:  Pain   Alternatives discussed:  No treatment Location:    Foot:  L big toe Pre-procedure details:    Skin preparation:  Alcohol   Preparation: Patient was prepped and draped in the usual sterile fashion   Anesthesia (see MAR for exact dosages):    Anesthesia method:  Nerve block   Block location:  Digital block   Block needle gauge:  25 G   Block anesthetic:  Lidocaine 2% w/o epi   Block injection procedure:  Anatomic landmarks identified, introduced needle, incremental injection and negative aspiration for blood   Block outcome:  Anesthesia achieved Nail Removal:    Nail removed:  Partial   Nail side:  Medial   Nail bed repaired: no   Trephination:    Subungual hematoma drained: no   Ingrown nail:    Wedge excision of skin: no   Nails trimmed:    Number of nails trimmed:  1 Post-procedure details:    Dressing:  Splint (area irrigated and nail replaced)   Patient tolerance of procedure:  Tolerated well, no immediate complications   (including critical care time)  Medications Ordered in ED Medications  lidocaine (XYLOCAINE) 2 % (with pres) injection 200 mg (has no administration in time range)     Initial Impression / Assessment and Plan / ED Course  I have reviewed the triage vital signs and the nursing notes.  Pertinent labs & imaging results that were available during my care of the patient were reviewed by me and considered in my medical decision making (see chart for details).        Patient is a 78 year old female with a fall down the steps today because she slipped.  Partially avulsed left great nail requiring digital block and realigning the nail  under the cuticle.  X-rays are negative for fracture of the foot or ankle.  Ankle is swollen and tender laterally suspecting sprain.  She has no  injury to the head or neck and takes no anticoagulation.  She has been able to ambulate and low suspicion for other underlying injury at this time.  Patient placed in an Aircast.  She was able to ambulate and did not need crutches at this time.  Tetanus shot is up-to-date  Final Clinical Impressions(s) / ED Diagnoses   Final diagnoses:  Avulsion of toenail of left foot  Sprain of left ankle, unspecified ligament, initial encounter    ED Discharge Orders    None       Gwyneth Sprout, MD 01/04/19 1706

## 2019-01-04 NOTE — ED Triage Notes (Signed)
[place] down her deck steps today injuring L great toe. The nail is pulled pack. Also c/o ankle pain.

## 2020-11-18 IMAGING — DX LEFT ANKLE COMPLETE - 3+ VIEW
3 series · 3 of 3 positions shown · non-contrast
Comparison: Concurrently obtained radiographs of the foot

CLINICAL DATA: 77-year-old female recently slipped on porch. Pain
and swelling along the lateral ankle and injury to the great toe
nail bed.

EXAM:
LEFT ANKLE COMPLETE - 3+ VIEW; LEFT FOOT - COMPLETE 3+ VIEW

[ankle ap]
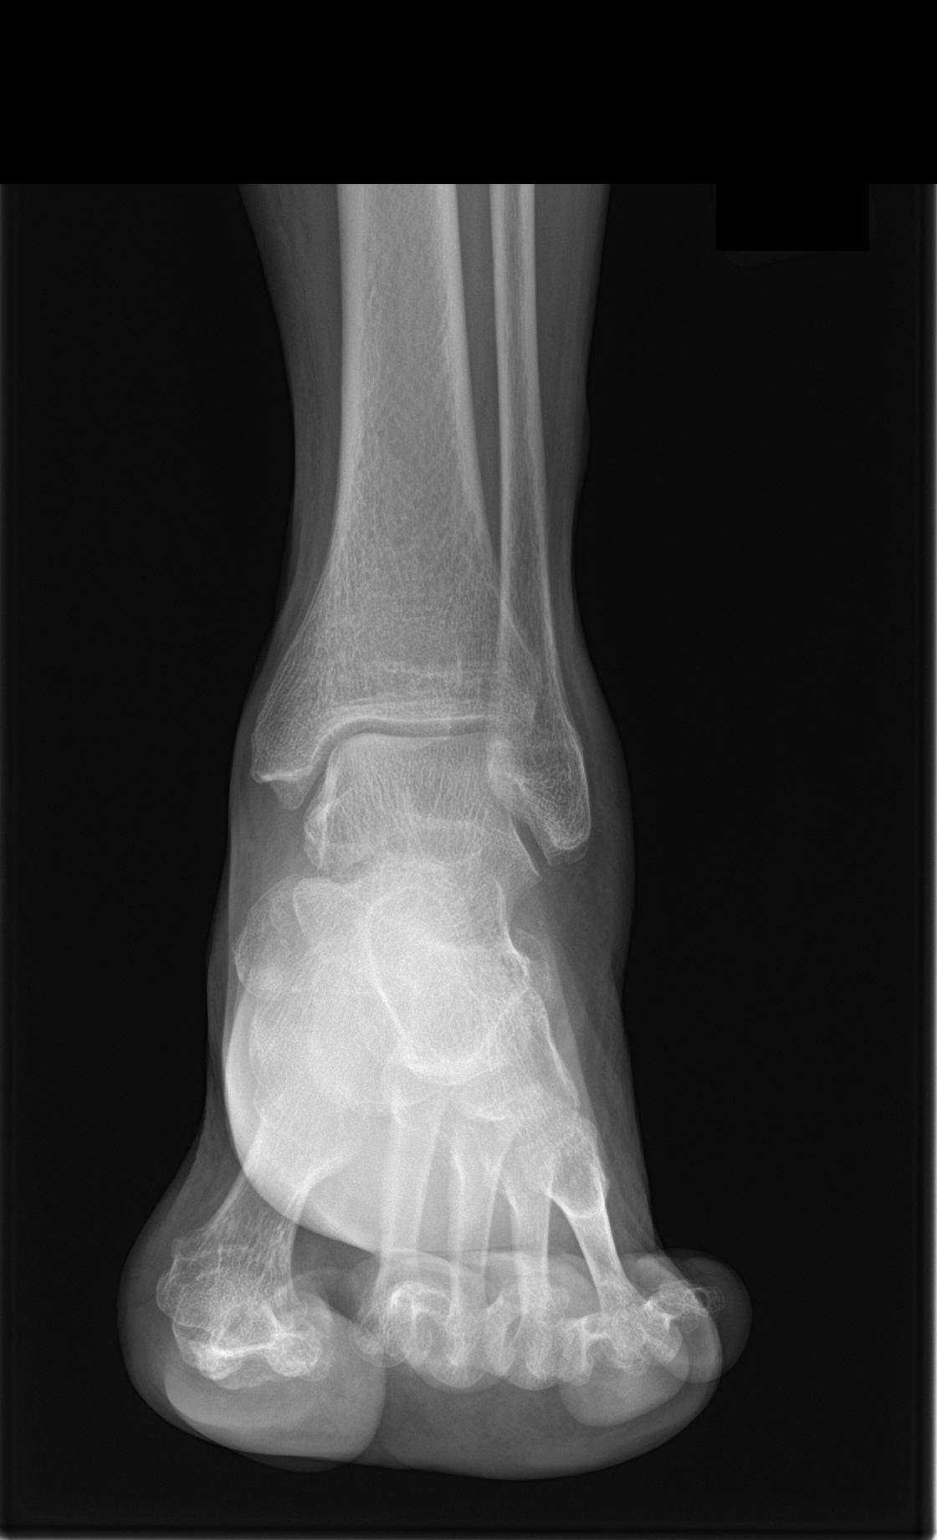

[ankle obl]
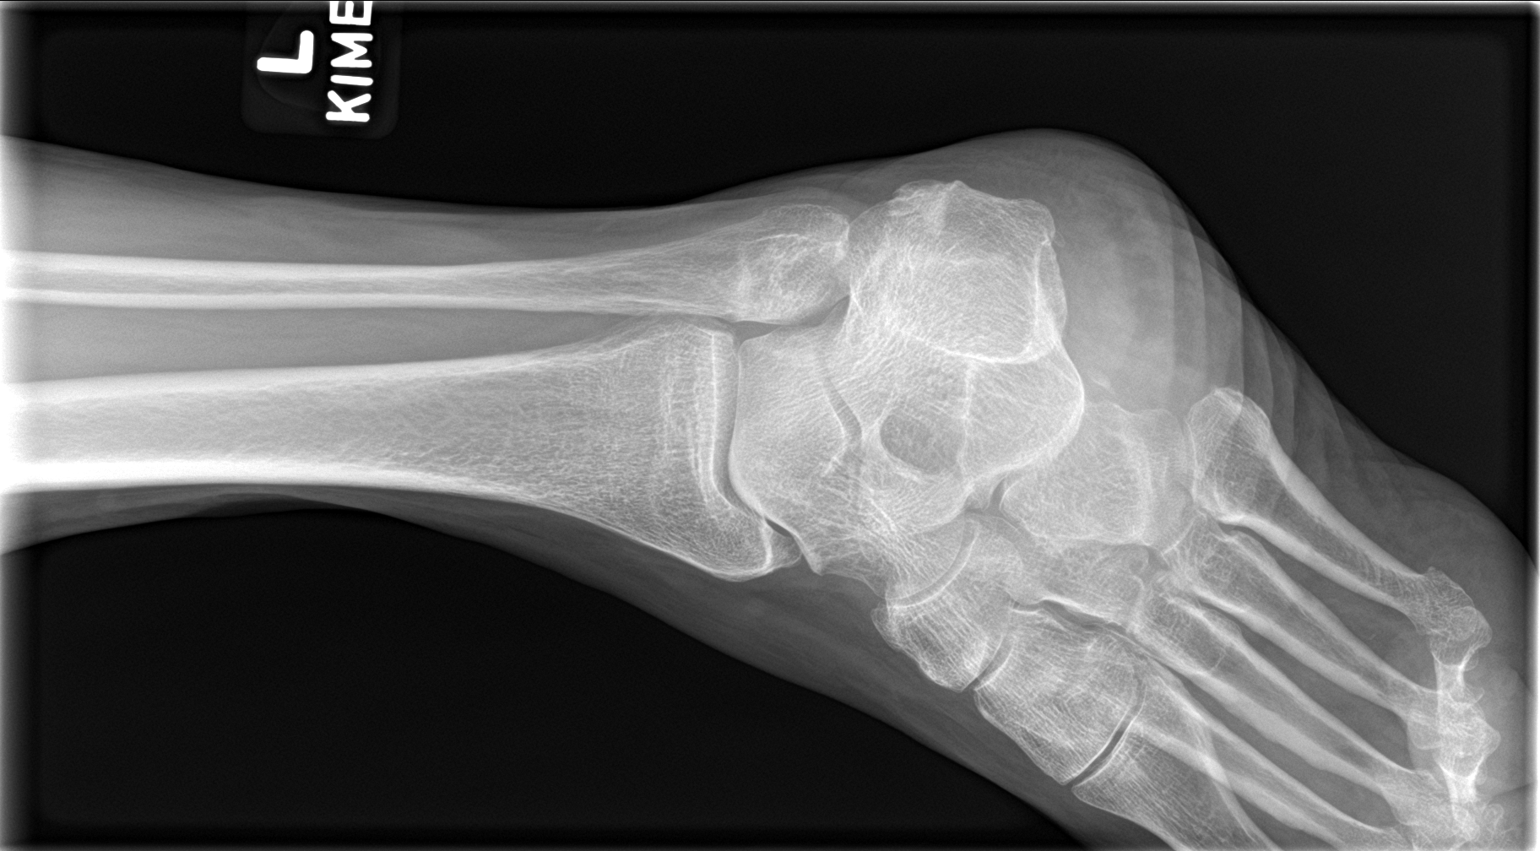

[ankle lat]
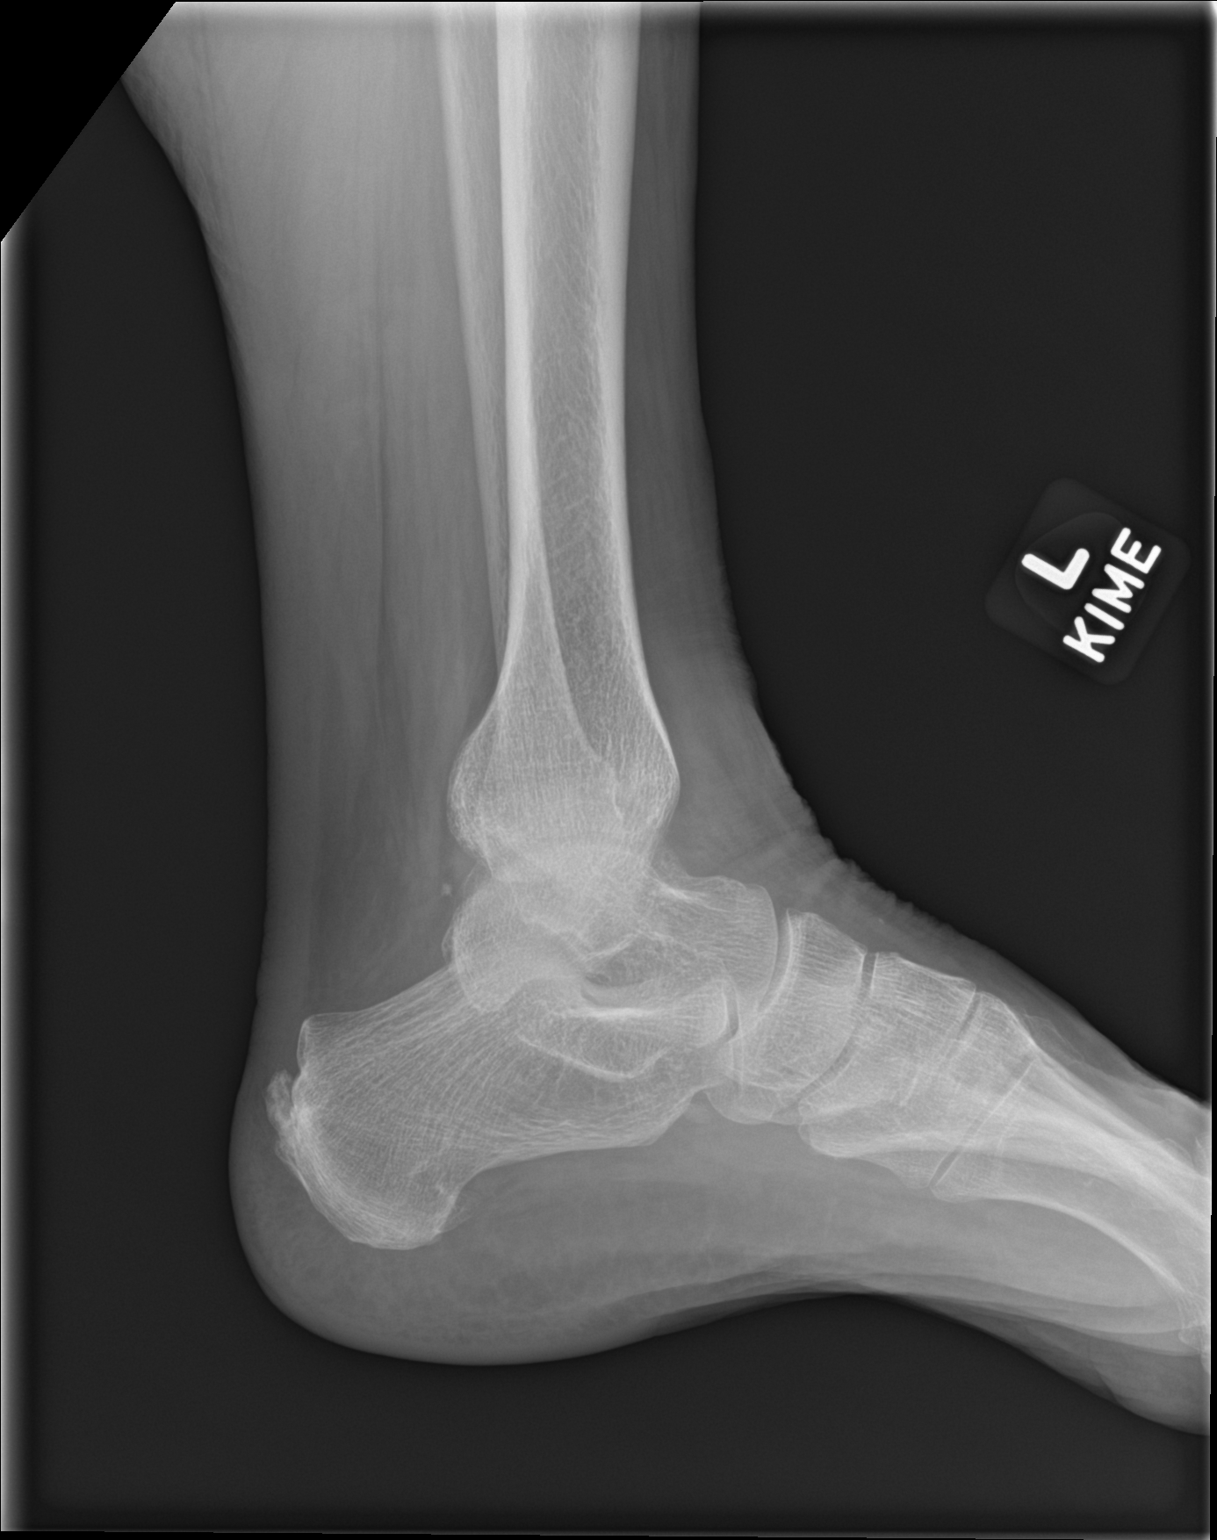

[3 of 3 positions shown; findings below may reference images not displayed]

FINDINGS: There is no evidence of fracture, dislocation, or joint effusion.
There is no evidence of arthropathy or other focal bone abnormality.
Soft tissues are unremarkable.
IMPRESSION: Negative.

## 2022-04-25 ENCOUNTER — Other Ambulatory Visit: Payer: Self-pay

## 2022-04-25 ENCOUNTER — Emergency Department (HOSPITAL_BASED_OUTPATIENT_CLINIC_OR_DEPARTMENT_OTHER): Payer: Medicare Other

## 2022-04-25 ENCOUNTER — Encounter (HOSPITAL_BASED_OUTPATIENT_CLINIC_OR_DEPARTMENT_OTHER): Payer: Self-pay | Admitting: *Deleted

## 2022-04-25 ENCOUNTER — Emergency Department (HOSPITAL_BASED_OUTPATIENT_CLINIC_OR_DEPARTMENT_OTHER)
Admission: EM | Admit: 2022-04-25 | Discharge: 2022-04-25 | Disposition: A | Discharge status: Home / Self Care | Payer: Medicare Other | Attending: Emergency Medicine | Admitting: Emergency Medicine

## 2022-04-25 DIAGNOSIS — M25562 Pain in left knee: Secondary | ICD-10-CM | POA: Diagnosis present

## 2022-04-25 DIAGNOSIS — W010XXA Fall on same level from slipping, tripping and stumbling without subsequent striking against object, initial encounter: Secondary | ICD-10-CM | POA: Diagnosis not present

## 2022-04-25 NOTE — ED Provider Notes (Signed)
[facility] EMERGENCY DEPARTMENT Provider Note   CSN: [id number] Arrival date & time: 04/25/22  1347     History  Chief Complaint  Patient presents with   Knee Injury    Anne Soto is a 81 y.o. female, no pertinent past medical history, who presents to the ED secondary to fall on her left knee that occurred last night, when she was walking.  She states she thinks she tripped on something, and she fell on her left knee, and has had pain since then.  She has been using Tylenol and which has been helping with the pain.  She has also been using a cane to help with her mobility she states that she feels like she that her knee is going to give out at times, but she is still able to put pressure on it.       Home Medications Prior to Admission medications   Medication Sig Start Date End Date Taking? Authorizing Provider  albuterol (PROVENTIL) (2.5 MG/3ML) 0.083% nebulizer solution Take 3 mLs (2.5 mg total) by nebulization every 6 (six) hours as needed for wheezing or shortness of breath. 09/12/17   Loren Racer, MD  amLODipine-benazepril (LOTREL) 10-20 MG per capsule Take 1 capsule by mouth daily.    [provider]  azelastine (ASTELIN) 0.1 % nasal spray PLACE 2 SPRAYS INTO BOTH NOSTRILS 2 (TWO) TIMES DAILY. 11/01/17   Alfonse Spruce, MD  benzonatate (TESSALON) 100 MG capsule Take 1 capsule (100 mg total) by mouth every 8 (eight) hours. Patient not taking: Reported on 10/08/2017 09/12/17   Loren Racer, MD  calcium-vitamin D (OSCAL WITH D) 250-125 MG-UNIT tablet Take 1 tablet by mouth daily.    [provider]  cetirizine (ZYRTEC) 10 MG tablet Take 10 mg by mouth daily.    [provider]  esomeprazole (NEXIUM) 40 MG capsule Take 40 mg by mouth 2 (two) times daily before a meal.    [provider]  ezetimibe (ZETIA) 10 MG tablet Take 10 mg by mouth daily.    [provider]  HYDROcodone-acetaminophen (NORCO/VICODIN) 5-325  MG tablet Take 1 tablet by mouth every 12 (twelve) hours.    [provider]  hydrOXYzine (ATARAX/VISTARIL) 25 MG tablet Take 25 mg by mouth once as needed.    [provider]  levothyroxine (SYNTHROID, LEVOTHROID) 112 MCG tablet Take 112 mcg by mouth daily before breakfast.    [provider]  meloxicam (MOBIC) 7.5 MG tablet Take 7.5 mg by mouth daily.    [provider]  montelukast (SINGULAIR) 10 MG tablet Take 10 mg by mouth at bedtime.    [provider]  predniSONE (DELTASONE) 20 MG tablet 3 tabs po day one, then 2 tabs daily x 4 days 09/12/17   Loren Racer, MD  ranitidine (ZANTAC) 150 MG tablet Take 1 tablet (150 mg total) by mouth 2 (two) times daily. 11/25/14   Mesner, Jenisis Cower, MD  sertraline (ZOLOFT) 100 MG tablet Take 100 mg by mouth daily.    [provider]  sucralfate (CARAFATE) 1 g tablet Take 1 tablet (1 g total) by mouth 4 (four) times daily -  with meals and at bedtime. Patient not taking: Reported on 10/08/2017 09/12/17   Loren Racer, MD  SYMBICORT 160-4.5 MCG/ACT inhaler TAKE 2 PUFFS BY MOUTH TWICE A DAY 11/01/17   Alfonse Spruce, MD  vitamin E 1000 UNIT capsule Take 1,000 Units by mouth daily.    [provider]  Allergies    Statins and Amoxil [amoxicillin]    Review of Systems   Review of Systems  Musculoskeletal:        +L knee pain  Skin:  Positive for wound.    Physical Exam Updated Vital Signs BP 131/71 (BP Location: Right Arm)   Pulse 76   Temp 98.1 F (36.7 C) (Oral)   Resp 18   SpO2 98%  Physical Exam Vitals and nursing note reviewed.  Constitutional:      General: She is not in acute distress.    Appearance: She is well-developed.  HENT:     Head: Normocephalic and atraumatic.  Eyes:     General:        Right eye: No discharge.        Left eye: No discharge.     Conjunctiva/sclera: Conjunctivae normal.  Cardiovascular:     Pulses: Normal pulses.  Pulmonary:      Effort: No respiratory distress.  Musculoskeletal:     Comments: Left/Right Knee: Tenderness to palpation of inferior aspect of patella. A mild effusion is present.  Negative anterior and posterior drawer.  +Patellar stability. Negative valgus and varus stress test. Extension and flexion intact. No sensory deficits.    Skin:    General: Skin is warm.     Capillary Refill: Capillary refill takes less than 2 seconds.     Comments: +superficial abrasion to left knee  Neurological:     Mental Status: She is alert.     Comments: Clear speech.   Psychiatric:        Behavior: Behavior normal.        Thought Content: Thought content normal.     ED Results / Procedures / Treatments   Labs (all labs ordered are listed, but only abnormal results are displayed) Labs Reviewed - No data to display  EKG None  Radiology DG Knee Complete 4 Views Left  Result Date: 04/25/2022 CLINICAL DATA:  Trauma, fall, pain EXAM: LEFT KNEE - COMPLETE 4+ VIEW COMPARISON:  None Available. FINDINGS: No recent fracture or dislocation is seen. There is no significant effusion. Smooth marginated calcification in the anterior aspect of proximal tibia may suggest ununited accessory ossification center or old avulsion. Minimal bony spurs seen in patella. IMPRESSION: No recent fracture or dislocation is seen left knee. Degenerative changes with minimal bony spurs are noted. Electronically Signed   By: Ernie Avena M.D.   On: 04/25/2022 14:42    Procedures Procedures    Medications Ordered in ED Medications - No data to display  ED Course/ Medical Decision Making/ A&P                           Medical Decision Making Patient is 81 year old female, here after a fall yesterday, when she fell on her left knee.  Did not hit her head, is not on any blood thinners.  Is able to walk, but has been using her cane to help offload some of the weight.  States that sometimes it feels like her knee is going to buckle.  Will  obtain x-ray of her knee, for further evaluation.  On exam for put talar ligament is intact, and she has no laxity.  Does have tenderness to palpation of the inferior aspect of knee.  No joint line tenderness.  Amount and/or Complexity of Data Reviewed Radiology: ordered.    Details: No fractures noted. Discussion of management or test interpretation with external provider(s):  Discussed with patient Ace wrap versus knee immobilizer she would like knee immobilizer for further stability of her knee.  Given knee immobilizer encouraged to follow-up PCP, return precautions emphasized.    Final Clinical Impression(s) / ED Diagnoses Final diagnoses:  Acute pain of left knee    Rx / DC Orders ED Discharge Orders     None         Kalina Morabito, Harley [place], PA 04/25/22 1541    Vanetta Mulders, MD 05/01/22 [phone]

## 2022-04-25 NOTE — Discharge Instructions (Signed)
Use your knee immobilizer as needed, for comfort.  Please follow-up with your primary care doctor and orthopedics for further evaluation.  Use ice, Tylenol, for symptomatic control.

## 2022-04-25 NOTE — ED Triage Notes (Signed)
Pt reports that she tripped and fell yesterday and fell on her left knee.  Pt has abrasion and pain and swelling to left knee.  Pt has been ambulatory but can not bear weight

## 2023-11-11 NOTE — Progress Notes (Unsigned)
 [facility]  PATIENT: Anne Soto DOB: 09-17-1940  REFERRING DOCTOR OR PCP: Katheryn Billing, PA-C SOURCE: Patient, notes from primary care, notes from [facility] neurology, imaging and lab reports, NCV/EMG results, MRI images personally reviewed.  _________________________________   HISTORICAL  CHIEF COMPLAINT:  Chief Complaint  Patient presents with   New Patient (Initial Visit)    Pt in room 11. Daughter Anne Soto in room. Paper referral imbalance,numbness, weakness, memory decline. Pt said leg numbness/tingling for about 3 years now, no falls, short term memory is concern.     HISTORY OF PRESENT ILLNESS:  I had the pleasure of seeing your patient, Anne Soto, at [facility] for neurologic consultation regarding her leg numbness loss and memory loss.  She is an 83 year old woman wo began to note some tingling and numbness in her feet around 2020.   Over time the numbness worsened, now up to her knees.  As the numbness progressed, especially the past year, she noted more issues with balance.  She stumbles some but no falls.    Her legs feel heavy and she notes a little foot drop.   She notes a little pain with a cramping sensation in the mornings but not as much later in the day.   She notes no change in bladder function.   She does take Vesicare for urinary urgency.      Numbness and weakness is only in her legs.    She has some LBP since a fall many years ago.  No neck pain.  She has seen Dr. Elnora, a neuromuscular specialist at [facility] and had an NCV/EMG in 2023 showing mild radiculopathy but no definite polyneuropathy.  Lab work for treatable causes of polyneuropathy was normal at that time.  She first noted difficulty with STM about 2 years ago.  Both she and other people have noticed and this has mildly progressed over the last couple of years.  She was started on donepezil 5 mg.  She has a history of panic attacks and  seasonal affective disorder, helped by sertraline  She has HTN and CRF  Her sister had MS and mother had PSP.          11/13/2023    2:29 PM  Montreal Cognitive Assessment   Visuospatial/ Executive (0/5) 3  Naming (0/3) 3  Attention: Read list of digits (0/2) 2  Attention: Read list of letters (0/1) 1  Attention: Serial 7 subtraction starting at 100 (0/3) 3  Language: Repeat phrase (0/2) 0  Language : Fluency (0/1) 0  Abstraction (0/2) 2  Delayed Recall (0/5) 4  Orientation (0/6) 6  Total 24     Imaging and studies: MRI of the brain 08/29/2022 showed mild generalized cortical atrophy and scattered T2/FLAIR hyperintense foci predominantly in the subcortical and deep white matter consistent with mild chronic microvascular ischemic change.  None of the foci appear to be acute.  Normal enhancement pattern.  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   EMG 12/05/21: This is an abnormal study. There is evidence for a moderate, left L4-S1 polyradiculopathy with ongoing denervation in the gastrocnemius . The moderate chronic reinnervation noted in the right tibialis anterior likely indicates that the polyradiculopathy affects both legs. There is not clear evidence for  a polyneuropathy.  EMG 07/12/18: Mild bilateral peroneal neuropathies based on CV of 70m/s across the fib head   REVIEW OF SYSTEMS: Constitutional: No fevers, chills, sweats, or change in appetite Eyes: No visual changes, double vision, eye pain Ear, nose and throat: No hearing loss, ear pain, nasal congestion, sore throat Cardiovascular: No chest pain, palpitations Respiratory:  No shortness of breath at rest or with exertion.   No wheezes GastrointestinaI: No nausea, vomiting, diarrhea, abdominal pain,  fecal incontinence Genitourinary:  No dysuria, urinary retention or frequency.  No nocturia. Musculoskeletal:  No neck pain, back pain Integumentary: No rash, pruritus, skin lesions Neurological: as above Psychiatric: No depression at this time.  No anxiety Endocrine: No palpitations, diaphoresis, change in appetite, change in weigh or increased thirst Hematologic/Lymphatic:  No anemia, purpura, petechiae. Allergic/Immunologic: No itchy/runny eyes, nasal congestion, recent allergic reactions, rashes  ALLERGIES: Allergies  Allergen Reactions   Statins Other (See Comments)    Gait Disturbance    Amoxil [Amoxicillin] Hives    HOME MEDICATIONS:  Current Outpatient Medications:    amLODipine (NORVASC) 5 MG tablet, Take 5 mg by mouth daily., Disp: , Rfl:    donepezil (ARICEPT) 5 MG tablet, Take 5 mg by mouth at bedtime., Disp: , Rfl:    ezetimibe (ZETIA) 10 MG tablet, Take 10 mg by mouth daily., Disp: , Rfl:    hydrOXYzine (ATARAX/VISTARIL) 25 MG tablet, Take 25 mg by mouth once as needed., Disp: , Rfl:    levothyroxine (SYNTHROID) 88 MCG tablet, Take 88 mcg by mouth daily before breakfast., Disp: , Rfl:    melatonin 5 MG TABS, Take 5 mg by mouth at bedtime., Disp: , Rfl:    montelukast (SINGULAIR) 10 MG tablet, Take 10 mg by mouth at bedtime., Disp: , Rfl:    RABEprazole (ACIPHEX) 20 MG tablet, Take 20 mg by mouth daily., Disp: , Rfl:    sertraline (ZOLOFT) 100 MG tablet, Take 100 mg by mouth daily., Disp: , Rfl:    solifenacin (VESICARE) 10 MG tablet, Take 10 mg by mouth daily., Disp: , Rfl:    albuterol  (PROVENTIL ) (2.5 MG/3ML) 0.083% nebulizer solution, Take 3 mLs (2.5 mg total) by nebulization every 6 (six) hours as needed for wheezing or shortness of breath. (Patient not taking: Reported on 11/13/2023), Disp: 75 mL, Rfl: 0   azelastine  (ASTELIN ) 0.1 % nasal spray, PLACE 2 SPRAYS INTO BOTH NOSTRILS 2 (TWO) TIMES DAILY. (Patient not taking: Reported on 11/13/2023), Disp: 30 mL, Rfl: 5    benzonatate  (TESSALON ) 100 MG capsule, Take 1 capsule (100 mg total) by mouth every 8 (eight) hours. (Patient not taking: Reported on 10/08/2017), Disp: 21 capsule, Rfl: 0   calcium-vitamin D (OSCAL WITH D) 250-125 MG-UNIT tablet, Take 1 tablet by mouth daily. (Patient not taking: Reported on 11/13/2023), Disp: , Rfl:    cetirizine (ZYRTEC) 10 MG tablet, Take 10 mg by mouth daily. (Patient not taking: Reported on 11/13/2023), Disp: , Rfl:    esomeprazole (NEXIUM) 40 MG capsule, Take 40 mg by mouth 2 (two) times daily before a meal., Disp: , Rfl:    HYDROcodone-acetaminophen  (NORCO/VICODIN) 5-325 MG tablet, Take 1 tablet by mouth every 12 (twelve) hours. (Patient not taking: Reported on 11/13/2023), Disp: , Rfl:    meloxicam (MOBIC) 7.5 MG tablet, Take 7.5 mg by mouth daily., Disp: , Rfl:    predniSONE  (DELTASONE ) 20 MG tablet, 3 tabs po day one, then 2 tabs daily x 4 days, Disp: 11 tablet, Rfl: 0   ranitidine  (ZANTAC )  150 MG tablet, Take 1 tablet (150 mg total) by mouth 2 (two) times daily., Disp: 60 tablet, Rfl: 0   sucralfate  (CARAFATE ) 1 g tablet, Take 1 tablet (1 g total) by mouth 4 (four) times daily -  with meals and at bedtime. (Patient not taking: Reported on 10/08/2017), Disp: 28 tablet, Rfl: 0   SYMBICORT  160-4.5 MCG/ACT inhaler, TAKE 2 PUFFS BY MOUTH TWICE A DAY (Patient not taking: Reported on 11/13/2023), Disp: 10.2 Inhaler, Rfl: 5   vitamin E 1000 UNIT capsule, Take 1,000 Units by mouth daily. (Patient not taking: Reported on 11/13/2023), Disp: , Rfl:   PAST MEDICAL HISTORY: Past Medical History:  Diagnosis Date   Asthma    GERD (gastroesophageal reflux disease)    Hyperlipidemia    Hypertension    Recurrent upper respiratory infection (URI)    Thyroid disease    Urticaria     PAST SURGICAL HISTORY: Past Surgical History:  Procedure Laterality Date   ABDOMINAL HYSTERECTOMY     ADENOIDECTOMY     APPENDECTOMY     CHOLECYSTECTOMY     FRACTURE SURGERY     TONSILLECTOMY      FAMILY  HISTORY: Family History  Problem Relation Age of Onset   Diabetes Mother    Hyperlipidemia Mother    Cancer Father    Hyperlipidemia Brother    Asthma Paternal Grandmother     SOCIAL HISTORY: Social History   Socioeconomic History   Marital status: Widowed    Spouse name: Not on file   Number of children: Not on file   Years of education: Not on file   Highest education level: Not on file  Occupational History   Not on file  Tobacco Use   Smoking status: Never   Smokeless tobacco: Never  Vaping Use   Vaping status: Never Used  Substance and Sexual Activity   Alcohol use: No   Drug use: No   Sexual activity: Not on file  Other Topics Concern   Not on file  Social History Narrative   Not on file   Social Drivers of Health   Financial Resource Strain: Not on file  Food Insecurity: Low Risk  (09/04/2022)   Received from [facility]   Hunger Vital Sign    Within the past 12 months, you worried that your food would run out before you got money to buy more: Never true    Within the past 12 months, the food you bought just didn't last and you didn't have money to get more. : Never true  Transportation Needs: No Transportation Needs (09/04/2022)   Received from [facility]    In the past 12 months, has lack of reliable transportation kept you from medical appointments, meetings, work or from getting things needed for daily living? : No  Physical Activity: Not on file  Stress: Not on file  Social Connections: Not on file  Intimate Partner Violence: Not on file       PHYSICAL EXAM  Vitals:   11/13/23 1425  BP: (!) 158/70  Pulse: 71  Weight: 117 lb 8 oz (53.3 kg)  Height: 5' (1.524 m)    Body mass index is 22.95 kg/m.   General: The patient is well-developed and well-nourished and in no acute distress  HEENT:  Head is [place]/AT.  Sclera are anicteric.  Funduscopic exam shows normal optic discs and retinal vessels.  Neck: No carotid bruits  are noted.  The neck is nontender.  Cardiovascular: The  heart has a regular rate and rhythm with a normal S1 and S2. There were no murmurs, gallops or rubs.    Skin: Extremities are without rash or  edema.  Musculoskeletal:  Back is nontender  Neurologic Exam  Mental status: The patient is alert and oriented x 3 at the time of the examination. The patient has apparent normal recent and remote memory, with an apparently normal attention span and concentration ability.   Speech is normal.  Cranial nerves: Extraocular movements are full. Pupils are equal, round, and reactive to light and accomodation.  Visual fields are full.  Facial symmetry is present. There is good facial sensation to soft touch bilaterally.Facial strength is normal.  Trapezius and sternocleidomastoid strength is normal. No dysarthria is noted.  The tongue is midline, and the patient has symmetric elevation of the soft palate. No obvious hearing deficits are noted.  Motor:  Muscle bulk is normal.   Tone is normal. Strength is  5 / 5 in all 4 extremities except 4+/5 toe extension  Sensory: Sensory testing is intact to pinprick, soft touch and vibration sensation in arms and proximal legs.   25% vibration at right ankle, 50% left, 5% at right toes 25% at left toes.   Pinprick sensation is normal.   .  Coordination: Cerebellar testing reveals good finger-nose-finger and heel-to-shin bilaterally.  Gait and station: Station is normal.   Gait has reduced stride.  Turn is wide.   Cannot tandem walk.  Romberg is positive.     Reflexes: Deep tendon reflexes are symmetric and normal in arms but trace at knees and absent at ankles.   Plantar responses are flexor.       ASSESSMENT AND PLAN  Polyneuropathy - Plan: NCV with EMG(electromyography), Copper , serum, Multiple Myeloma Panel (SPEP&IFE w/QIG)  Numbness - Plan: NCV with EMG(electromyography), Copper , serum, Multiple Myeloma Panel (SPEP&IFE w/QIG)  Seasonal depression  (HCC)   In summary, Anne Soto is an 83 year old woman who has had progressive numbness in her legs over the past 5 years that has worsened over the last year and now affects her gait.  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  However, NCV in 2023 did not show this finding.  Although radiculopathy was seen on EMG at that time, MRI did not show any severe findings.  We will check lab work for treatable causes of polyneuropathy and also have her do an NCV/EMG study.  If these do not show findings that could explain her symptoms, then we would need to also consider the possibility of myelopathy and will check MRI of the cervical and thoracic spine.  Although she has some discomfort in the mornings, pain is minimal most of the day.  Therefore, we will hold off on adding a medication for this..  She scored 24/30 on the [facility] cognitive assessment consistent with mild cognitive impairment.  She scored 4/5 on delayed recall.  This makes Alzheimer's disease less likely.  However, if symptoms progress consider amyloid/tau biomarker test to further evaluate test to further evaluate.  Thank you for asking me to see Anne Soto.  Please let me know if I can be of further assistance with her or other patients in the future.   I did not schedule follow-up but based on the results of the study we may schedule this or she should call if new or worsening neurologic symptoms.    Ac Colan A. Vear, MD, [facility] 11/13/2023, 5:59 PM Certified in Neurology, Clinical Neurophysiology,  Sleep Medicine and Neuroimaging  [facility] [address], [address] [place], [place] [address] [phone]

## 2023-11-13 ENCOUNTER — Ambulatory Visit (INDEPENDENT_AMBULATORY_CARE_PROVIDER_SITE_OTHER): Admitting: Neurology

## 2023-11-13 ENCOUNTER — Encounter: Payer: Self-pay | Admitting: Neurology

## 2023-11-13 VITALS — BP 158/70 | HR 71 | Ht 60.0 in | Wt 117.5 lb

## 2023-11-13 DIAGNOSIS — R200 Anesthesia of skin: Secondary | ICD-10-CM | POA: Diagnosis not present

## 2023-11-13 DIAGNOSIS — F338 Other recurrent depressive disorders: Secondary | ICD-10-CM | POA: Diagnosis not present

## 2023-11-13 DIAGNOSIS — G629 Polyneuropathy, unspecified: Secondary | ICD-10-CM

## 2023-11-15 ENCOUNTER — Ambulatory Visit: Payer: Self-pay | Admitting: Neurology

## 2023-11-15 LAB — MULTIPLE MYELOMA PANEL, SERUM
Albumin SerPl Elph-Mcnc: 3.7 g/dL (ref 2.9–4.4)
Albumin/Glob SerPl: 1.5 (ref 0.7–1.7)
Alpha 1: 0.2 g/dL (ref 0.0–0.4)
Alpha2 Glob SerPl Elph-Mcnc: 0.6 g/dL (ref 0.4–1.0)
B-Globulin SerPl Elph-Mcnc: 1 g/dL (ref 0.7–1.3)
Gamma Glob SerPl Elph-Mcnc: 0.8 g/dL (ref 0.4–1.8)
Globulin, Total: 2.5 g/dL (ref 2.2–3.9)
IgA/Immunoglobulin A, Serum: 196 mg/dL (ref 64–422)
IgG (Immunoglobin G), Serum: 781 mg/dL (ref 586–1602)
IgM (Immunoglobulin M), Srm: 69 mg/dL (ref 26–217)
Total Protein: 6.2 g/dL (ref 6.0–8.5)

## 2023-11-15 LAB — COPPER, SERUM: Copper: 84 ug/dL (ref 80–158)

## 2024-01-09 ENCOUNTER — Telehealth: Payer: Self-pay | Admitting: Neurology

## 2024-01-09 ENCOUNTER — Ambulatory Visit (INDEPENDENT_AMBULATORY_CARE_PROVIDER_SITE_OTHER): Admitting: Neurology

## 2024-01-09 VITALS — BP 132/78

## 2024-01-09 DIAGNOSIS — G629 Polyneuropathy, unspecified: Secondary | ICD-10-CM

## 2024-01-09 DIAGNOSIS — R200 Anesthesia of skin: Secondary | ICD-10-CM | POA: Diagnosis not present

## 2024-01-09 DIAGNOSIS — R2689 Other abnormalities of gait and mobility: Secondary | ICD-10-CM | POA: Diagnosis not present

## 2024-01-09 DIAGNOSIS — R269 Unspecified abnormalities of gait and mobility: Secondary | ICD-10-CM

## 2024-01-09 NOTE — Telephone Encounter (Signed)
 no auth required sent to [facility] [phone]

## 2024-01-09 NOTE — Telephone Encounter (Signed)
 Appears Dr. Onita just ordered MRI today and order sent to [facility] for pt to schedule.  You can get pt scheduled with Dr. Vear once pt scheduled for MRI

## 2024-01-09 NOTE — Procedures (Signed)
 Full Name: Anne Soto Gender: Female MRN #: [id number] Date of Birth: 06-21-1940    Visit Date: 01/09/2024 09:48 Age: 83 Years Examining Physician: Onita Duos Referring Physician: Vear Ade Height: 5 feet 0 inch History: 83 year old female presenting with 5 years history gradual onset gait abnormality, mild bilateral lower extremity paresthesia  Summary of the test:  Nerve conduction study: Bilateral sural sensory response showed moderately decreased snap amplitude.  Bilateral superficial peroneal sensory response was absent.  Bilateral tibial motor response showed moderately decreased CMAP amplitude, with mildly prolonged F-wave latency. Left peroneal to EDB motor response showed significantly decreased CMAP amplitude.  Right peroneal motor response was absent.  Left median sensory response showed mildly decreased snap amplitude, left ulnar sensory response showed slightly prolonged peak latency in the setting of cold limb temperature, well-preserved snap amplitude.  Left radial sensory response within normal limit.  Left median, ulnar motor responses were normal.  Electromyography: Select needle examination showed chronic neuropathic changes involving bilateral L4-5 myotomes.  There was no evidence of active denervation at bilateral lumbosacral paraspinal muscles. Selective needle examination of left upper extremity muscles and cervical paraspinal muscles were normal.  Conclusion: This is a mild abnormal study.  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    ------------------------------- Duos Onita. M.D. Ph.D. .  [facility] [address] [place], [place] [address] Tel: [phone] Fax: [phone]  Verbal informed consent was obtained from the patient, patient was informed of potential risk of  procedure, including bruising, bleeding, hematoma formation, infection, muscle weakness, muscle pain, numbness, among others.        MNC    Nerve / Sites Muscle Latency Ref. Amplitude Ref. Rel Amp Segments Distance Velocity Ref. Area    ms ms mV mV %  cm m/s m/s mVms  L Median - APB     Wrist APB 3.9 <=4.4 4.6 >=4.0 100 Wrist - APB 7   15.6     Upper arm APB 8.0  4.6  100 Upper arm - Wrist 21 51 >=49 16.5  L Ulnar - ADM     Wrist ADM 3.4 <=3.3 7.2 >=6.0 100 Wrist - ADM 7   24.2     B.Elbow ADM 5.0  7.3  102 B.Elbow - Wrist 9 56 >=49 24.2     A.Elbow ADM 7.3  6.9  93.9 A.Elbow - B.Elbow 12 52 >=49 23.9  L Peroneal - EDB     Ankle EDB 6.7 <=6.5 0.7 >=2.0 100 Ankle - EDB 9   1.8     Fib head EDB 11.8  0.7  100 Fib head - Ankle 21 41 >=44 1.8     Pop fossa EDB 14.5  0.8  119 Pop fossa - Fib head 10 37 >=44 1.9         Pop fossa - Ankle      L Tibial - AH     Ankle AH 5.3 <=5.8 1.8 >=4.0 100 Ankle - AH 9   4.0     Pop fossa AH 13.5  1.1  58.4 Pop fossa - Ankle 34 42 >=41 3.0  R Tibial - AH     Ankle AH 6.4 <=5.8 1.6 >=4.0 100 Ankle - AH 9   0.8     Pop fossa AH 15.9  0.7  46.1 Pop fossa - Ankle 32 34 >=41 0.3  R Peroneal - EDB  Fib Head EDB NR <=4.7 NR >=3.0 NR Fib Head - Tib Ant 10   NR     Pop fossa EDB      Pop fossa - Fib Head 23 NR >=44                  SNC    Nerve / Sites Rec. Site Peak Lat Ref.  Amp Ref. Segments Distance Peak Diff Ref.    ms ms V V  cm ms ms  L Radial - Anatomical snuff box (Forearm)     Forearm Wrist 2.5 <=2.9 15 >=15 Forearm - Wrist 10    L Sural - Ankle (Calf)     Calf Ankle 3.8 <=4.4 3 >=6 Calf - Ankle 14    R Sural - Ankle (Calf)     Calf Ankle 4.6 <=4.4 3 >=6 Calf - Ankle 14    L Superficial peroneal - Ankle     Lat leg Ankle NR <=4.4 NR >=6 Lat leg - Ankle 14    R Superficial peroneal - Ankle     Lat leg Ankle NR <=4.4 NR >=6 Lat leg - Ankle 14    L Median, Ulnar - Transcarpal comparison     Median Palm Wrist 2.3 <=2.2 27 >=35 Median Palm  - Wrist 8       Ulnar Palm Wrist 2.2 <=2.2 15 >=12 Ulnar Palm - Wrist 8          Median Palm - Ulnar Palm  0.1 <=0.4  L Median - Orthodromic (Dig II, Mid palm)     Dig II Wrist 3.5 <=3.4 8 >=10 Dig II - Wrist 13    L Ulnar - Orthodromic, (Dig V, Mid palm)     Dig V Wrist 3.3 <=3.1 8 >=5 Dig V - Wrist 80                       F  Wave    Nerve F Lat Ref.   ms ms  L Tibial - AH 59.1 <=56.0  R Tibial - AH  <=56.0  L Median - APB 27.8 <=31.0  L Ulnar - ADM 25.7 <=32.0             EMG Summary Table    Spontaneous MUAP Recruitment  Muscle IA Fib PSW Fasc Other Amp Dur. Poly Pattern  L. Tibialis anterior Normal None None None _______ Normal Normal Normal Reduced  L. Tibialis posterior Normal None None None _______ Normal Normal Normal Reduced  L. Peroneus longus Normal None None None _______ Normal Normal Normal Reduced  L. Gastrocnemius (Medial head) Normal None None None _______ Normal Normal Normal Reduced  L. Vastus lateralis Normal None None None _______ Normal Normal Normal Reduced  L. Lumbar paraspinals (low) Normal None None None _______ Normal Normal Normal Normal  R. Lumbar paraspinals (low) Normal None None None _______ Normal Normal Normal Normal  R. Lumbar paraspinals (mid) Normal None None None _______ Normal Normal Normal Normal  R. Tibialis anterior Normal None None None _______ Normal Normal Normal Reduced  R. Tibialis posterior Normal None None None _______ Normal Normal Normal Reduced  R. Peroneus longus Normal None None None _______ Normal Normal Normal Reduced  R. Gastrocnemius (Medial head) Normal None None None _______ Normal Normal Normal Reduced  R. Vastus lateralis Normal None None None _______ Normal Normal Normal Normal  L. First dorsal interosseous Normal None None None _______ Normal Normal Normal Normal  L. Pronator teres  Normal None None None _______ Normal Normal Normal Normal  L. Biceps brachii Normal None None None _______ Normal Normal Normal Normal  L.  Deltoid Normal None None None _______ Normal Normal Normal Normal  L. Triceps brachii Normal None None None _______ Normal Normal Normal Normal  L. Cervical paraspinals Normal None None None _______ Normal Normal Normal Normal

## 2024-01-09 NOTE — Telephone Encounter (Signed)
 Patient has a lot of gait abnormality, out of proportion to mild to moderate peripheral neuropathy, chronic lumbar radiculopathy,  Previous multiple MRI of the brain described T2/FLAIR hyperintensity, raise the possibility of MS,   Repeat MRI brain,  Follow-up with Dr. Vear afterwards, advised her to bring previous MRI from [facility] for comparison

## 2024-01-09 NOTE — Progress Notes (Signed)
 Chief Complaint  Patient presents with   ncs emg     Emg rm 3 Pt is well and ready for ncs emg    ASSESSMENT AND PLAN  Anne Soto is a 83 y.o. 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  Her gait abnormality is clearly out of proportion to the mild ankle dorsiflexion weakness and neuropathy  Likely central nervous system abnormality is a major contributing factor, she has tried and failed to improve with physical therapy  Repeat MRI of the brain  Follow-up with Dr. Vear  DIAGNOSTIC DATA (LABS, IMAGING, TESTING) - I reviewed patient records, labs, notes, testing and imaging myself where available.   MEDICAL HISTORY:  Anne Soto, is a 83 year old female, seen in request by Dr. Vear Ade for electrodiagnostic study to evaluate her gradual onset gait abnormality  History is obtained from the patient and review of electronic medical records. I personally reviewed pertinent available imaging films in PACS.   PMHx of  Mild cognitive impairment Hypertension Hypothyroidism Anxiety  She lives alone, used to be very active, traveled extensively, around 2020, she began to some gait abnormality, left foot bother her more than the right initially, now there was no significant difference, she also noticed mild bilateral feet numbness tingling, denies significant low back pain, did have slow worsening urinary urgency, frequency, she denies upper extremity sensory or motor deficit  She was seen by Dr. Elnora at [facility] most recent visit September 2023, described abnormal MRI of the  brain from May 2021, multiple hypodensity T2 signal white matter lesion within periventricular white matter, concerning for demyelinating disease, also raise the possibility of chronic microvascular ischemia  EMG July 2023, moderate left L4-S1 radiculopathy, no clear evidence of polyneuropathy  Had extensive imaging study over the years,  MRI of brain April 2024 from [facility], no acute abnormality, substantial change of T2 flair FLAIR hyperdensity within the white matter, differentiation diagnosis raise the possibility of chronic demyelinating versus chronic small vessel disease  MRI of lumbar spine August 2023, multilevel degenerative changes, variable degree of foraminal narrowing no significant canal stenosis  Laboratory July 2025, normal protein electrophoresis, copper , thyroid functional test, A1c 5.2, B12, vitamin D, ferritin, CMP showed mild decrease GFR of 45   Her sister has multiple sclerosis, mother has progressive nuclear palsy at age 64, presenting with gait abnormality  PHYSICAL EXAM:   Vitals:   01/09/24 1012  BP: 132/78      PHYSICAL EXAMNIATION:  Gen: NAD, conversant, well nourised, well groomed                     Cardiovascular: Regular rate rhythm, no peripheral edema, warm, nontender. Eyes: Conjunctivae clear without exudates or hemorrhage Neck: Supple, no carotid bruits. Pulmonary: Clear to auscultation bilaterally   NEUROLOGICAL EXAM:  MENTAL STATUS: Speech/cognition: Awake, alert, oriented to history taking and casual conversation CRANIAL NERVES: CN II: Visual fields are full to confrontation. Pupils are round equal and briskly reactive to light. CN III, IV, VI: extraocular movement are normal. No ptosis. CN V: Facial sensation is intact to light touch CN VII: Face is symmetric with normal eye closure  CN  VIII: Hearing is normal to causal conversation. CN IX, X: Phonation is normal. CN XI: Head turning and shoulder shrug are intact  MOTOR: mild  bilateral ankle dorsiflexion weakness  REFLEXES: Areflexia  SENSORY: Well-preserved pinprick, proprioception at the toes, decreased vibratory sensation to distal shin level  COORDINATION: No limb dysmetria, mild trunk ataxia  GAIT/STANCE: Push-up to get up from seated position, wide base, cautious,  REVIEW OF SYSTEMS:  Full 14 system review of systems performed and notable only for as above All other review of systems were negative.   ALLERGIES: Allergies  Allergen Reactions   Statins Other (See Comments)    Gait Disturbance    Amoxil [Amoxicillin] Hives    HOME MEDICATIONS: Current Outpatient Medications  Medication Sig Dispense Refill   albuterol  (PROVENTIL ) (2.5 MG/3ML) 0.083% nebulizer solution Take 3 mLs (2.5 mg total) by nebulization every 6 (six) hours as needed for wheezing or shortness of breath. (Patient not taking: Reported on 11/13/2023) 75 mL 0   amLODipine (NORVASC) 5 MG tablet Take 5 mg by mouth daily.     azelastine  (ASTELIN ) 0.1 % nasal spray PLACE 2 SPRAYS INTO BOTH NOSTRILS 2 (TWO) TIMES DAILY. (Patient not taking: Reported on 11/13/2023) 30 mL 5   calcium-vitamin D (OSCAL WITH D) 250-125 MG-UNIT tablet Take 1 tablet by mouth daily. (Patient not taking: Reported on 11/13/2023)     cetirizine (ZYRTEC) 10 MG tablet Take 10 mg by mouth daily. (Patient not taking: Reported on 11/13/2023)     donepezil (ARICEPT) 5 MG tablet Take 5 mg by mouth at bedtime.     ezetimibe (ZETIA) 10 MG tablet Take 10 mg by mouth daily.     HYDROcodone-acetaminophen  (NORCO/VICODIN) 5-325 MG tablet Take 1 tablet by mouth every 12 (twelve) hours. (Patient not taking: Reported on 11/13/2023)     hydrOXYzine (ATARAX/VISTARIL) 25 MG tablet Take 25 mg by mouth once as needed.     levothyroxine (SYNTHROID) 88 MCG tablet Take 88 mcg by mouth daily before breakfast.     melatonin 5 MG TABS Take 5 mg by mouth at bedtime.     montelukast (SINGULAIR) 10 MG tablet Take 10 mg by mouth at bedtime.      RABEprazole (ACIPHEX) 20 MG tablet Take 20 mg by mouth daily.     sertraline (ZOLOFT) 100 MG tablet Take 100 mg by mouth daily.     solifenacin (VESICARE) 10 MG tablet Take 10 mg by mouth daily.     SYMBICORT  160-4.5 MCG/ACT inhaler TAKE 2 PUFFS BY MOUTH TWICE A DAY (Patient not taking: Reported on 11/13/2023) 10.2 Inhaler 5   vitamin E 1000 UNIT capsule Take 1,000 Units by mouth daily. (Patient not taking: Reported on 11/13/2023)     No current facility-administered medications for this visit.    PAST MEDICAL HISTORY: Past Medical History:  Diagnosis Date   Asthma    GERD (gastroesophageal reflux disease)    Hyperlipidemia    Hypertension    Recurrent upper respiratory infection (URI)    Thyroid disease    Urticaria     PAST SURGICAL HISTORY: Past Surgical History:  Procedure Laterality Date   ABDOMINAL HYSTERECTOMY     ADENOIDECTOMY     APPENDECTOMY     CHOLECYSTECTOMY     FRACTURE SURGERY     TONSILLECTOMY      FAMILY HISTORY: Family History  Problem Relation Age of Onset   Diabetes Mother    Hyperlipidemia Mother    Cancer Father    Hyperlipidemia Brother  Asthma Paternal Grandmother     SOCIAL HISTORY: Social History   Socioeconomic History   Marital status: Widowed    Spouse name: Not on file   Number of children: Not on file   Years of education: Not on file   Highest education level: Not on file  Occupational History   Not on file  Tobacco Use   Smoking status: Never   Smokeless tobacco: Never  Vaping Use   Vaping status: Never Used  Substance and Sexual Activity   Alcohol use: No   Drug use: No   Sexual activity: Not on file  Other Topics Concern   Not on file  Social History Narrative   Not on file   Social Drivers of Health   Financial Resource Strain: Not on file  Food Insecurity: Low Risk  (09/04/2022)   Received from [facility]   Hunger Vital Sign    Within the past 12 months, you worried that your food would run out before you  got money to buy more: Never true    Within the past 12 months, the food you bought just didn't last and you didn't have money to get more. : Never true  Transportation Needs: No Transportation Needs (09/04/2022)   Received from [facility]    In the past 12 months, has lack of reliable transportation kept you from medical appointments, meetings, work or from getting things needed for daily living? : No  Physical Activity: Not on file  Stress: Not on file  Social Connections: Not on file  Intimate Partner Violence: Not on file      Modena Callander, M.D. Ph.D.  [facility] [address], [address] [place], [place] [address] Ph: [phone] Fax: [phone]  CC:  Duwayne Katheryn HERO, [place] [address] [place],  [place] [address]  Duwayne Katheryn HERO, [place]

## 2024-01-10 NOTE — Telephone Encounter (Signed)
 Called pt and got her scheduled with Dr. Vear for 01/14/2024 @ 3pm.

## 2024-01-14 ENCOUNTER — Other Ambulatory Visit: Payer: Self-pay | Admitting: Neurology

## 2024-01-14 ENCOUNTER — Ambulatory Visit: Admitting: Neurology

## 2024-01-14 MED ORDER — ALPRAZOLAM 0.5 MG PO TABS: ORAL_TABLET | ORAL | 0 refills | Status: AC

## 2024-01-14 NOTE — Telephone Encounter (Signed)
 Called pt at [phone]. Went over instructions for xanax  that Dr. Vear sent in. She verified she will have driver to and from MRI since medication can cause drowsiness.

## 2024-01-14 NOTE — Telephone Encounter (Signed)
 Called pt and let her know that I had made a mistake when I called her last week to schedule her for her appointment on today. Her appointment was supposed to be scheduled after her MRI due 01/22/2024. Told pt I will reschedule her for a later date.

## 2024-01-14 NOTE — Telephone Encounter (Signed)
 Called pt back and got her scheduled to see Dr. Vear after her MRI on 01/22/2024. Pt has been scheduled for 02/05/2024 @3pm .  Pt is asking for Valium or something to calm her nerves for her MRI. Sending to MD to fill for pt.

## 2024-01-22 ENCOUNTER — Ambulatory Visit
Admission: RE | Admit: 2024-01-22 | Discharge: 2024-01-22 | Disposition: A | Discharge status: Home / Self Care | Source: Ambulatory Visit | Attending: Neurology | Admitting: Neurology

## 2024-01-22 ENCOUNTER — Telehealth: Payer: Self-pay | Admitting: Neurology

## 2024-01-22 DIAGNOSIS — R200 Anesthesia of skin: Secondary | ICD-10-CM | POA: Diagnosis not present

## 2024-01-22 DIAGNOSIS — G629 Polyneuropathy, unspecified: Secondary | ICD-10-CM

## 2024-01-22 DIAGNOSIS — R269 Unspecified abnormalities of gait and mobility: Secondary | ICD-10-CM | POA: Diagnosis not present

## 2024-01-22 NOTE — Telephone Encounter (Signed)
Patient called to verify appointment date and time

## 2024-01-28 ENCOUNTER — Ambulatory Visit: Payer: Self-pay | Admitting: Neurology

## 2024-02-05 ENCOUNTER — Ambulatory Visit (INDEPENDENT_AMBULATORY_CARE_PROVIDER_SITE_OTHER): Admitting: Neurology

## 2024-02-05 ENCOUNTER — Encounter: Payer: Self-pay | Admitting: Neurology

## 2024-02-05 VITALS — BP 114/78 | HR 78 | Ht 60.0 in | Wt 114.5 lb

## 2024-02-05 DIAGNOSIS — R200 Anesthesia of skin: Secondary | ICD-10-CM

## 2024-02-05 DIAGNOSIS — R269 Unspecified abnormalities of gait and mobility: Secondary | ICD-10-CM | POA: Diagnosis not present

## 2024-02-05 DIAGNOSIS — G3184 Mild cognitive impairment, so stated: Secondary | ICD-10-CM | POA: Diagnosis not present

## 2024-02-05 MED ORDER — DONEPEZIL HCL 10 MG PO TABS: 10.0000 mg | ORAL_TABLET | Freq: Every day | ORAL | 4 refills | Status: AC

## 2024-02-05 NOTE — Progress Notes (Signed)
 [facility]  PATIENT: Anne Soto DOB: May 27, 1940  REFERRING DOCTOR OR PCP: Katheryn Billing, PA-C SOURCE: Patient, notes from primary care, notes from [facility] neurology, imaging and lab reports, NCV/EMG results, MRI images personally reviewed.  _________________________________   HISTORICAL  CHIEF COMPLAINT:  Chief Complaint  Patient presents with   RM 61    Patient is here with friend today - here for MRI follow    HISTORY OF PRESENT ILLNESS:  Anne Soto is a 83 y.o. woman withleg numbness loss and memory loss.  Followup 02/05/2024: Since the last visit, she has had an MRI brai showing atrophy (medial temporal lobe>general) and mild chonic microvascular changes.      She first noted difficulty with STM about 2 years ago.  Both she and other people have noticed and this has mildly progressed over the last couple of years.  She was started on donepezil 5 mg around June 2025.   She is tolerating it well.     She has a history of panic attacks and seasonal affective disorder, helped by sertraline  She has HTN and CRF  Her sister had MS and mother had PSP.   Her sister had MS.    She reports foot numbness is the same.   She has some pain.   She feels off balanced, much worse if she blinks.    She is an 83 year old woman wo began to note some tingling and numbness in her feet around 2020.   Over time the numbness worsened, now up to her knees.  As the numbness progressed, especially the past year, she noted more issues with balance.  She stumbles some but no falls.    Her legs feel heavy and she notes a little foot drop.   She notes a little pain with a cramping sensation in the mornings but not as much later in the day.   She notes no change in bladder function.   She does take Vesicare for urinary urgency.      Numbness and weakness is only in her legs.    She has some LBP since a fall many years ago.  No neck pain.  She has seen Dr. Elnora, a  neuromuscular specialist at [facility] and had an NCV/EMG in 2023 showing mild radiculopathy but no definite polyneuropathy.  Lab work for treatable causes of polyneuropathy was normal at that time.       11/13/2023    2:29 PM  Montreal Cognitive Assessment   Visuospatial/ Executive (0/5) 3  Naming (0/3) 3  Attention: Read list of digits (0/2) 2  Attention: Read list of letters (0/1) 1  Attention: Serial 7 subtraction starting at 100 (0/3) 3  Language: Repeat phrase (0/2) 0  Language : Fluency (0/1) 0  Abstraction (0/2) 2  Delayed Recall (0/5) 4  Orientation (0/6) 6  Total 24     Imaging and studies: MRI brian 01/22/2024 MRI of the brain 08/29/2022 showed mild generalized cortical atrophy and scattered T2/FLAIR hyperintense foci predominantly in the subcortical and deep white matter consistent with mild chronic microvascular ischemic change.  None of the foci appear to be acute.  Normal enhancement pattern.  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   EMG 12/05/21: This is an abnormal  study. There is evidence for a moderate, left L4-S1 polyradiculopathy with ongoing denervation in the gastrocnemius . The moderate chronic reinnervation noted in the right tibialis anterior likely indicates that the polyradiculopathy affects both legs. There is not clear evidence for a polyneuropathy.  EMG 07/12/18: Mild bilateral peroneal neuropathies based on CV of 82m/s across the fib head   REVIEW OF SYSTEMS: Constitutional: No fevers, chills, sweats, or change in appetite Eyes: No visual changes, double vision, eye pain Ear, nose and throat: No hearing loss, ear pain, nasal congestion, sore throat Cardiovascular: No chest pain, palpitations Respiratory:  No shortness  of breath at rest or with exertion.   No wheezes GastrointestinaI: No nausea, vomiting, diarrhea, abdominal pain, fecal incontinence Genitourinary:  No dysuria, urinary retention or frequency.  No nocturia. Musculoskeletal:  No neck pain, back pain Integumentary: No rash, pruritus, skin lesions Neurological: as above Psychiatric: No depression at this time.  No anxiety Endocrine: No palpitations, diaphoresis, change in appetite, change in weigh or increased thirst Hematologic/Lymphatic:  No anemia, purpura, petechiae. Allergic/Immunologic: No itchy/runny eyes, nasal congestion, recent allergic reactions, rashes  ALLERGIES: Allergies  Allergen Reactions   Statins Other (See Comments)    Gait Disturbance    Amoxil [Amoxicillin] Hives    HOME MEDICATIONS:  Current Outpatient Medications:    albuterol  (PROVENTIL ) (2.5 MG/3ML) 0.083% nebulizer solution, Take 3 mLs (2.5 mg total) by nebulization every 6 (six) hours as needed for wheezing or shortness of breath., Disp: 75 mL, Rfl: 0   ALPRAZolam  (XANAX ) 0.5 MG tablet, Take one or two po before the MRI, Disp: 2 tablet, Rfl: 0   amLODipine (NORVASC) 5 MG tablet, Take 5 mg by mouth daily., Disp: , Rfl:    calcium-vitamin D (OSCAL WITH D) 250-125 MG-UNIT tablet, Take 1 tablet by mouth daily., Disp: , Rfl:    cetirizine (ZYRTEC) 10 MG tablet, Take 10 mg by mouth daily., Disp: , Rfl:    donepezil (ARICEPT) 10 MG tablet, Take 1 tablet (10 mg total) by mouth at bedtime., Disp: 90 tablet, Rfl: 4   hydrOXYzine (ATARAX/VISTARIL) 25 MG tablet, Take 25 mg by mouth once as needed., Disp: , Rfl:    levothyroxine (SYNTHROID) 88 MCG tablet, Take 88 mcg by mouth daily before breakfast., Disp: , Rfl:    melatonin 5 MG TABS, Take 5 mg by mouth at bedtime., Disp: , Rfl:    montelukast (SINGULAIR) 10 MG tablet, Take 10 mg by mouth at bedtime., Disp: , Rfl:    RABEprazole (ACIPHEX) 20 MG tablet, Take 20 mg by mouth daily., Disp: , Rfl:    sertraline (ZOLOFT) 100  MG tablet, Take 100 mg by mouth daily., Disp: , Rfl:    solifenacin (VESICARE) 10 MG tablet, Take 10 mg by mouth daily., Disp: , Rfl:    azelastine  (ASTELIN ) 0.1 % nasal spray, PLACE 2 SPRAYS INTO BOTH NOSTRILS 2 (TWO) TIMES DAILY. (Patient not taking: Reported on 11/13/2023), Disp: 30 mL, Rfl: 5   ezetimibe (ZETIA) 10 MG tablet, Take 10 mg by mouth daily., Disp: , Rfl:    HYDROcodone-acetaminophen  (NORCO/VICODIN) 5-325 MG tablet, Take 1 tablet by mouth every 12 (twelve) hours. (Patient not taking: Reported on 02/05/2024), Disp: , Rfl:    SYMBICORT  160-4.5 MCG/ACT inhaler, TAKE 2 PUFFS BY MOUTH TWICE A DAY (Patient not taking: Reported on 02/05/2024), Disp: 10.2 Inhaler, Rfl: 5   vitamin E 1000 UNIT capsule, Take 1,000 Units by mouth daily. (Patient not taking: Reported on 02/05/2024), Disp: , Rfl:   PAST MEDICAL  HISTORY: Past Medical History:  Diagnosis Date   Asthma    GERD (gastroesophageal reflux disease)    Hyperlipidemia    Hypertension    Recurrent upper respiratory infection (URI)    Thyroid disease    Urticaria     PAST SURGICAL HISTORY: Past Surgical History:  Procedure Laterality Date   ABDOMINAL HYSTERECTOMY     ADENOIDECTOMY     APPENDECTOMY     CHOLECYSTECTOMY     FRACTURE SURGERY     TONSILLECTOMY      FAMILY HISTORY: Family History  Problem Relation Age of Onset   Diabetes Mother    Hyperlipidemia Mother    Cancer Father    Hyperlipidemia Brother    Asthma Paternal Grandmother     SOCIAL HISTORY: Social History   Socioeconomic History   Marital status: Widowed    Spouse name: Not on file   Number of children: Not on file   Years of education: Not on file   Highest education level: Not on file  Occupational History   Not on file  Tobacco Use   Smoking status: Never   Smokeless tobacco: Never  Vaping Use   Vaping status: Never Used  Substance and Sexual Activity   Alcohol use: No   Drug use: No   Sexual activity: Not on file  Other Topics Concern    Not on file  Social History Narrative   Takes powder coffee twice a day in 2 glasses of milk, occasionally has tea, 1 coke a day    Social Drivers of Corporate investment banker Strain: Not on file  Food Insecurity: Low Risk  (09/04/2022)   Received from [facility]   Hunger Vital Sign    Within the past 12 months, you worried that your food would run out before you got money to buy more: Never true    Within the past 12 months, the food you bought just didn't last and you didn't have money to get more. : Never true  Transportation Needs: No Transportation Needs (09/04/2022)   Received from [facility]    In the past 12 months, has lack of reliable transportation kept you from medical appointments, meetings, work or from getting things needed for daily living? : No  Physical Activity: Not on file  Stress: Not on file  Social Connections: Not on file  Intimate Partner Violence: Not on file       PHYSICAL EXAM  Vitals:   02/05/24 1447  BP: 114/78  Pulse: 78  SpO2: 97%  Weight: 114 lb 8 oz (51.9 kg)  Height: 5' (1.524 m)    Body mass index is 22.36 kg/m.   General: The patient is well-developed and well-nourished and in no acute distress  HEENT:  Head is [place]/AT.  Sclera are anicteric.  Funduscopic exam shows normal optic discs and retinal vessels.  Neck: No carotid bruits are noted.  The neck is nontender.  Cardiovascular: The heart has a regular rate and rhythm with a normal S1 and S2. There were no murmurs, gallops or rubs.    Skin: Extremities are without rash or  edema.  Musculoskeletal:  Back is nontender  Neurologic Exam  Mental status: The patient is alert and oriented x 3 at the time of the examination. The patient has apparent normal recent and remote memory, with an apparently normal attention span and concentration ability.   Speech is normal.  Cranial nerves: Extraocular movements are full. Pupils are equal, round,  and reactive to  light and accomodation.  Visual fields are full.  Facial symmetry is present. There is good facial sensation to soft touch bilaterally.Facial strength is normal.  Trapezius and sternocleidomastoid strength is normal. No dysarthria is noted.  The tongue is midline, and the patient has symmetric elevation of the soft palate. No obvious hearing deficits are noted.  Motor:  Muscle bulk is normal.   Tone is normal. Strength is  5 / 5 in all 4 extremities except 4+/5 toe extension  Sensory: Sensory testing is intact to pinprick, soft touch and vibration sensation in arms and proximal legs.   25% vibration at ankles,  5% at right toes .   Pinprick sensation is normal.   .  Coordination: Cerebellar testing reveals good finger-nose-finger and heel-to-shin bilaterally.  Gait and station: Station is normal.   Gait has reduced stride.  Turn is wide.   Cannot tandem walk.  Romberg is positive.     Reflexes: Deep tendon reflexes are symmetric and normal in arms but trace at knees and absent at ankles.   Plantar responses are flexor.       ASSESSMENT AND PLAN  Mild cognitive impairment - Plan: ATN PROFILE  Gait difficulty - Plan: MR CERVICAL SPINE WO CONTRAST  Numbness - Plan: MR CERVICAL SPINE WO CONTRAST   For MCI ad atrophy check ATN profile and increase donepezil to 10 mg.   If ATN c/w AD, then consider adding memantine For gait issues, check MRI cervical spine to assess for myelopathy/spinal stenosis.  She has numbness but NCV/EMG ([facility]) did not show polyneuropathy RTC 6-7 months  This visit is part of a comprehensive longitudinal care medical relationship regarding the patients primary diagnosis of MCI and related concerns.    Karmen Altamirano A. Vear, MD, [place] RENO 02/05/2024, 3:24 PM Certified in Neurology, Clinical Neurophysiology, Sleep Medicine and Neuroimaging  [facility] [address] [place], [place] [address] [phone]

## 2024-02-07 ENCOUNTER — Telehealth: Payer: Self-pay | Admitting: Neurology

## 2024-02-07 NOTE — Telephone Encounter (Signed)
 no auth required sent to [facility] [phone]

## 2024-02-08 ENCOUNTER — Telehealth: Payer: Self-pay | Admitting: Neurology

## 2024-02-08 ENCOUNTER — Ambulatory Visit: Payer: Self-pay | Admitting: Neurology

## 2024-02-08 LAB — ATN PROFILE
A -- Beta-amyloid 42/40 Ratio: 0.093 — AB (ref >0.102)
Beta-amyloid 40: 409.05 pg/mL
Beta-amyloid 42: 37.92 pg/mL
N -- NfL, Plasma: 9.88 pg/mL — AB (ref 0.00–9.13)
T -- p-tau181: 2.75 pg/mL — AB (ref 0.00–0.97)

## 2024-02-11 MED ORDER — MEMANTINE HCL 10 MG PO TABS: ORAL_TABLET | ORAL | 11 refills | Status: DC

## 2024-02-11 NOTE — Telephone Encounter (Signed)
 I spoke to her daughter, Lavetta, about the blood work results.  Decrease amyloid beta 42/40 ratio, increase [id number] and increase neurofilament light is all consistent with Alzheimer's disease.  She is already on donepezil and usually tolerates it well.  I will add memantine.    I briefly discussed the antiamyloid antibodies.  More importantly, she should try to increase physical and cognitive and social activity and try to improve her diet  She is currently scheduled to return and April.  If the family feels a earlier appointment is needed we will try to get her in sooner.

## 2024-02-11 NOTE — Telephone Encounter (Signed)
 Pt daughter (Diona) DPR called  requesting for  Pt to be seen sooner then next year appt . Pt daughter is concern . Pt daughter also stated if MD can call her Pt Daughter  Phone not Pt Phone   Phone is  [phone]

## 2024-02-12 NOTE — Telephone Encounter (Signed)
 Pt daughter Anne Soto to Follow up about  family getting a time to schedule an appt with Dr Vear . Pt daughter states she has not heard back and just wanted to follow up with Nurse . About getting Scheduled to come in sooner   Callback  [phone]

## 2024-02-13 NOTE — Telephone Encounter (Signed)
 She left me a voice mail about scheduling the MRI. Please also tell her that the MRI is to be scheduled at [facility] she will need to call them back to schedule, they are the ones who left her a voice mail, thanks!. [phone]

## 2024-02-15 ENCOUNTER — Other Ambulatory Visit

## 2024-02-16 ENCOUNTER — Other Ambulatory Visit

## 2024-02-19 ENCOUNTER — Ambulatory Visit: Admitting: Neurology

## 2024-02-19 ENCOUNTER — Encounter: Payer: Self-pay | Admitting: Neurology

## 2024-02-19 VITALS — BP 159/68 | HR 70 | Ht 60.0 in | Wt 115.5 lb

## 2024-02-19 DIAGNOSIS — G3184 Mild cognitive impairment, so stated: Secondary | ICD-10-CM

## 2024-02-19 DIAGNOSIS — R200 Anesthesia of skin: Secondary | ICD-10-CM | POA: Diagnosis not present

## 2024-02-19 DIAGNOSIS — G309 Alzheimer's disease, unspecified: Secondary | ICD-10-CM | POA: Diagnosis not present

## 2024-02-19 DIAGNOSIS — R269 Unspecified abnormalities of gait and mobility: Secondary | ICD-10-CM

## 2024-02-19 DIAGNOSIS — F338 Other recurrent depressive disorders: Secondary | ICD-10-CM

## 2024-02-19 DIAGNOSIS — F0280 Dementia in other diseases classified elsewhere without behavioral disturbance: Secondary | ICD-10-CM

## 2024-02-19 NOTE — Progress Notes (Signed)
 [facility]  PATIENT: Anne Soto DOB: [birth year]  REFERRING DOCTOR OR PCP: Anne Billing, PA-C SOURCE: Patient, notes from primary care, notes from [facility] neurology, imaging and lab reports, NCV/EMG results, MRI images personally reviewed.  _________________________________   HISTORICAL  CHIEF COMPLAINT:  Chief Complaint  Patient presents with   Follow-up    Pt in room 10. Daughters in room. Here for memory follow up.MOCA:26    HISTORY OF PRESENT ILLNESS:  Anne Soto is a 83 y.o. woman withleg numbness loss and memory loss.  Followup 02/19/2024: I last saw her on 02/05/2024 for mild cognitive impairment.  At the time, MRI brain showing atrophy (medial temporal lobe>general) and mild chonic microvascular changes.  At that visit we checked some additional labs.  The Amyloid Beta 42/40 ratio was low and pTau181 was elevated c/w Alzheimer's.  TSH and B12 were fine.    She first noted difficulty with STM around 2023.  Both she and other people have noticed and this has mildly progressed over the last couple of years.  She was started on donepezil 5 mg around June 2025.  I increase this to 10 mg in September 2025.  She is tolerating it well.     She has a history of panic attacks and seasonal affective disorder, helped by sertraline  She has HTN and CRF  Her sister had MS and mother had PSP.   Her sister had MS.    She reports foot numbness is the same.   She has some pain.   She feels off balanced, much worse if she blinks.    Additionally, she has had numbness since 2020.  Over time the numbness worsened, now up to her knees.  As the numbness progressed, especially the past year, she noted more issues with balance.  She stumbles some but no falls.    Her legs feel heavy and she notes a little foot drop.   She notes a little pain with a cramping sensation in the mornings but not as much later in the day.   She notes no change in bladder function.   She  does take Vesicare for urinary urgency.      Numbness and weakness is only in her legs.    She has some LBP since a fall many years ago.  No neck pain.  She has seen Dr. Elnora, a neuromuscular specialist at [facility] and had an NCV/EMG in 2023 showing mild radiculopathy but no definite polyneuropathy.  Lab work for treatable causes of polyneuropathy was normal at that time.  Also due to her gait difficulties, an MRI of the cervical spine has been ordered.  It has not yet been done.        02/19/2024    1:33 PM 11/13/2023    2:29 PM  Montreal Cognitive Assessment   Visuospatial/ Executive (0/5) 4 3  Naming (0/3) 3 3  Attention: Read list of digits (0/2) 2 2  Attention: Read list of letters (0/1) 1 1  Attention: Serial 7 subtraction starting at 100 (0/3) 3 3  Language: Repeat phrase (0/2) 0 0  Language : Fluency (0/1) 1 0  Abstraction (0/2) 2 2  Delayed Recall (0/5) 4 4  Orientation (0/6) 6 6  Total 26 24     Imaging and studies: MRI brain 01/22/2024   MRI of the brain 08/29/2022 showed mild generalized cortical atrophy and scattered T2/FLAIR hyperintense foci predominantly in the subcortical and deep white matter consistent with  mild chronic microvascular ischemic change.  None of the foci appear to be acute.  Normal enhancement pattern.  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   EMG 12/05/21: This is an abnormal study. There is evidence for a moderate, left L4-S1 polyradiculopathy with ongoing denervation in the gastrocnemius . The moderate chronic reinnervation noted in the right tibialis anterior likely indicates that the polyradiculopathy affects both legs. There is not clear evidence for a polyneuropathy.  EMG 07/12/18: Mild bilateral peroneal  neuropathies based on CV of 63m/s across the fib head   REVIEW OF SYSTEMS: Constitutional: No fevers, chills, sweats, or change in appetite Eyes: No visual changes, double vision, eye pain Ear, nose and throat: No hearing loss, ear pain, nasal congestion, sore throat Cardiovascular: No chest pain, palpitations Respiratory:  No shortness of breath at rest or with exertion.   No wheezes GastrointestinaI: No nausea, vomiting, diarrhea, abdominal pain, fecal incontinence Genitourinary:  No dysuria, urinary retention or frequency.  No nocturia. Musculoskeletal:  No neck pain, back pain Integumentary: No rash, pruritus, skin lesions Neurological: as above Psychiatric: No depression at this time.  No anxiety Endocrine: No palpitations, diaphoresis, change in appetite, change in weigh or increased thirst Hematologic/Lymphatic:  No anemia, purpura, petechiae. Allergic/Immunologic: No itchy/runny eyes, nasal congestion, recent allergic reactions, rashes  ALLERGIES: Allergies  Allergen Reactions   Statins Other (See Comments)    Gait Disturbance    Amoxil [Amoxicillin] Hives    HOME MEDICATIONS:  Current Outpatient Medications:    albuterol  (PROVENTIL ) (2.5 MG/3ML) 0.083% nebulizer solution, Take 3 mLs (2.5 mg total) by nebulization every 6 (six) hours as needed for wheezing or shortness of breath., Disp: 75 mL, Rfl: 0   ALPRAZolam  (XANAX ) 0.5 MG tablet, Take one or two po before the MRI, Disp: 2 tablet, Rfl: 0   amLODipine (NORVASC) 5 MG tablet, Take 5 mg by mouth daily., Disp: , Rfl:    calcium-vitamin D (OSCAL WITH D) 250-125 MG-UNIT tablet, Take 1 tablet by mouth daily., Disp: , Rfl:    cetirizine (ZYRTEC) 10 MG tablet, Take 10 mg by mouth daily., Disp: , Rfl:    donepezil (ARICEPT) 10 MG tablet, Take 1 tablet (10 mg total) by mouth at bedtime., Disp: 90 tablet, Rfl: 4   ezetimibe (ZETIA) 10 MG tablet, Take 10 mg by mouth daily., Disp: , Rfl:    HYDROcodone-acetaminophen  (NORCO/VICODIN)  5-325 MG tablet, Take 1 tablet by mouth every 12 (twelve) hours., Disp: , Rfl:    hydrOXYzine (ATARAX/VISTARIL) 25 MG tablet, Take 25 mg by mouth once as needed., Disp: , Rfl:    levothyroxine (SYNTHROID) 88 MCG tablet, Take 88 mcg by mouth daily before breakfast., Disp: , Rfl:    melatonin 5 MG TABS, Take 5 mg by mouth at bedtime., Disp: , Rfl:    memantine (NAMENDA) 10 MG tablet, For 5 days, take 1/2 pill a day.  For the next 5 days, take 1/2 pill twice a day.  Then increase to 1 pill twice a day., Disp: 60 tablet, Rfl: 11   montelukast (SINGULAIR) 10 MG tablet, Take 10 mg by mouth at bedtime., Disp: , Rfl:    RABEprazole (ACIPHEX) 20 MG tablet, Take 20 mg by mouth daily., Disp: , Rfl:    sertraline (ZOLOFT) 100 MG tablet, Take 100 mg  by mouth daily., Disp: , Rfl:    solifenacin (VESICARE) 10 MG tablet, Take 10 mg by mouth daily., Disp: , Rfl:    SYMBICORT  160-4.5 MCG/ACT inhaler, TAKE 2 PUFFS BY MOUTH TWICE A DAY, Disp: 10.2 Inhaler, Rfl: 5   vitamin E 1000 UNIT capsule, Take 1,000 Units by mouth daily., Disp: , Rfl:   PAST MEDICAL HISTORY: Past Medical History:  Diagnosis Date   Asthma    GERD (gastroesophageal reflux disease)    Hyperlipidemia    Hypertension    Recurrent upper respiratory infection (URI)    Thyroid disease    Urticaria     PAST SURGICAL HISTORY: Past Surgical History:  Procedure Laterality Date   ABDOMINAL HYSTERECTOMY     ADENOIDECTOMY     APPENDECTOMY     CHOLECYSTECTOMY     FRACTURE SURGERY     TONSILLECTOMY      FAMILY HISTORY: Family History  Problem Relation Age of Onset   Diabetes Mother    Hyperlipidemia Mother    Cancer Father    Hyperlipidemia Brother    Asthma Paternal Grandmother     SOCIAL HISTORY: Social History   Socioeconomic History   Marital status: Widowed    Spouse name: Not on file   Number of children: Not on file   Years of education: Not on file   Highest education level: Not on file  Occupational History   Not on  file  Tobacco Use   Smoking status: Never   Smokeless tobacco: Never  Vaping Use   Vaping status: Never Used  Substance and Sexual Activity   Alcohol use: No   Drug use: No   Sexual activity: Not on file  Other Topics Concern   Not on file  Social History Narrative   Takes powder coffee twice a day in 2 glasses of milk, occasionally has tea, 1 coke a day    Social Drivers of Corporate investment banker Strain: Not on file  Food Insecurity: Low Risk  (09/04/2022)   Received from [facility]   Hunger Vital Sign    Within the past 12 months, you worried that your food would run out before you got money to buy more: Never true    Within the past 12 months, the food you bought just didn't last and you didn't have money to get more. : Never true  Transportation Needs: No Transportation Needs (09/04/2022)   Received from [facility]    In the past 12 months, has lack of reliable transportation kept you from medical appointments, meetings, work or from getting things needed for daily living? : No  Physical Activity: Not on file  Stress: Not on file  Social Connections: Not on file  Intimate Partner Violence: Not on file       PHYSICAL EXAM  Vitals:   02/19/24 1330  BP: (!) 159/68  Pulse: 70  Weight: 115 lb 8 oz (52.4 kg)  Height: 5' (1.524 m)     Body mass index is 22.56 kg/m.   General: The patient is well-developed and well-nourished and in no acute distress  HEENT:  Head is [place]/AT.  Sclera are anicteric.  Funduscopic exam shows normal optic discs and retinal vessels.  Neck: No carotid bruits are noted.  The neck is nontender.  Cardiovascular: The heart has a regular rate and rhythm with a normal S1 and S2. There were no murmurs, gallops or rubs.    Skin: Extremities are without rash or  edema.  Musculoskeletal:  Back is nontender  Neurologic Exam  Mental status: The patient is alert and oriented x 3 at the time of the examination. The  patient has apparent normal recent and remote memory, with an apparently normal attention span and concentration ability.   Speech is normal.  Cranial nerves: Extraocular movements are full. Pupils are equal, round, and reactive to light and accomodation.  Visual fields are full.  Facial symmetry is present. There is good facial sensation to soft touch bilaterally.Facial strength is normal.  Trapezius and sternocleidomastoid strength is normal. No dysarthria is noted.  The tongue is midline, and the patient has symmetric elevation of the soft palate. No obvious hearing deficits are noted.  Motor:  Muscle bulk is normal.   Tone is normal. Strength is  5 / 5 in all 4 extremities except 4+/5 toe extension  Sensory: Sensory testing is intact to pinprick, soft touch and vibration sensation in arms and proximal legs.   25% vibration at ankles,  5% at right toes .   Pinprick sensation is normal.   .  Coordination: Cerebellar testing reveals good finger-nose-finger and heel-to-shin bilaterally.  Gait and station: Station is normal.   The gait has a reduced stride.  Her turn is unstable.  She is unable to do a tandem walk.    Romberg is positive.     Reflexes: Deep tendon reflexes are symmetric and normal in arms but trace at knees and absent at ankles.   Plantar responses are flexor.       ASSESSMENT AND PLAN  Alzheimer's disease (HCC)  Mild cognitive impairment  Gait difficulty  Numbness  Seasonal depression   We had a long conversation about the significance of her biomarker studies.  Beta 42/40 ratio was decreased and the pTau181 was increased.  These changes are consistent with Alzheimer's disease.  She is currently on donepezil and memantine.  We discussed the antiamyloid antibodies.  She is not interested in doing that type of treatment at this time.   For gait issues, she will have MRI cervical spine to assess for myelopathy/spinal stenosis.  She has numbness but NCV/EMG ([facility]) did not  show polyneuropathy RTC 6-7 months  This visit is part of a comprehensive longitudinal care medical relationship regarding the patients primary diagnosis of MCI and related concerns.    Mays Paino A. Vear, MD, [facility] 02/19/2024, 7:47 PM Certified in Neurology, Clinical Neurophysiology, Sleep Medicine and Neuroimaging  [facility] [address], [address] [place], [place] [address] [phone]

## 2024-03-03 ENCOUNTER — Ambulatory Visit
Admission: RE | Admit: 2024-03-03 | Discharge: 2024-03-03 | Disposition: A | Discharge status: Home / Self Care | Source: Ambulatory Visit | Attending: Neurology | Admitting: Neurology

## 2024-03-03 DIAGNOSIS — R269 Unspecified abnormalities of gait and mobility: Secondary | ICD-10-CM | POA: Diagnosis not present

## 2024-03-03 DIAGNOSIS — R200 Anesthesia of skin: Secondary | ICD-10-CM | POA: Diagnosis not present

## 2024-03-06 ENCOUNTER — Other Ambulatory Visit: Payer: Self-pay | Admitting: Neurology

## 2024-09-04 ENCOUNTER — Ambulatory Visit: Admitting: Neurology
